# Patient Record
Sex: Female | Born: 1975
Health system: Southern US, Community
[De-identification: ages and names within clinical notes are randomized; demographics above are authoritative.]

## PROBLEM LIST (undated history)

## (undated) DIAGNOSIS — R600 Localized edema: Secondary | ICD-10-CM

## (undated) DIAGNOSIS — F419 Anxiety disorder, unspecified: Secondary | ICD-10-CM

## (undated) DIAGNOSIS — F909 Attention-deficit hyperactivity disorder, unspecified type: Secondary | ICD-10-CM

## (undated) DIAGNOSIS — M549 Dorsalgia, unspecified: Secondary | ICD-10-CM

## (undated) DIAGNOSIS — K5900 Constipation, unspecified: Secondary | ICD-10-CM

## (undated) DIAGNOSIS — R51 Headache: Secondary | ICD-10-CM

## (undated) DIAGNOSIS — F329 Major depressive disorder, single episode, unspecified: Secondary | ICD-10-CM

## (undated) DIAGNOSIS — B009 Herpesviral infection, unspecified: Secondary | ICD-10-CM

## (undated) DIAGNOSIS — F32A Depression, unspecified: Secondary | ICD-10-CM

## (undated) DIAGNOSIS — G5600 Carpal tunnel syndrome, unspecified upper limb: Secondary | ICD-10-CM

## (undated) DIAGNOSIS — M2550 Pain in unspecified joint: Secondary | ICD-10-CM

## (undated) DIAGNOSIS — F99 Mental disorder, not otherwise specified: Secondary | ICD-10-CM

## (undated) DIAGNOSIS — G039 Meningitis, unspecified: Secondary | ICD-10-CM

## (undated) DIAGNOSIS — L709 Acne, unspecified: Secondary | ICD-10-CM

## (undated) DIAGNOSIS — K08409 Partial loss of teeth, unspecified cause, unspecified class: Secondary | ICD-10-CM

## (undated) DIAGNOSIS — B379 Candidiasis, unspecified: Secondary | ICD-10-CM

## (undated) DIAGNOSIS — O99119 Other diseases of the blood and blood-forming organs and certain disorders involving the immune mechanism complicating pregnancy, unspecified trimester: Secondary | ICD-10-CM

## (undated) DIAGNOSIS — D696 Thrombocytopenia, unspecified: Secondary | ICD-10-CM

## (undated) HISTORY — DX: Acne, unspecified: L70.9

## (undated) HISTORY — DX: Herpesviral infection, unspecified: B00.9

## (undated) HISTORY — DX: Constipation, unspecified: K59.00

## (undated) HISTORY — DX: Candidiasis, unspecified: B37.9

## (undated) HISTORY — DX: Carpal tunnel syndrome, unspecified upper limb: G56.00

## (undated) HISTORY — PX: OTHER SURGICAL HISTORY: SHX169

## (undated) HISTORY — DX: Pain in unspecified joint: M25.50

## (undated) HISTORY — DX: Attention-deficit hyperactivity disorder, unspecified type: F90.9

## (undated) HISTORY — DX: Dorsalgia, unspecified: M54.9

## (undated) HISTORY — DX: Localized edema: R60.0

## (undated) HISTORY — DX: Meningitis, unspecified: G03.9

## (undated) HISTORY — DX: Partial loss of teeth, unspecified cause, unspecified class: K08.409

## (undated) HISTORY — DX: Headache: R51

## (undated) HISTORY — PX: BREAST SURGERY: SHX581

---

## 1999-10-12 ENCOUNTER — Encounter: Admission: RE | Admit: 1999-10-12 | Discharge: 1999-10-12 | Payer: Self-pay | Admitting: Psychiatry

## 2001-09-15 DIAGNOSIS — K08409 Partial loss of teeth, unspecified cause, unspecified class: Secondary | ICD-10-CM

## 2001-09-15 HISTORY — DX: Partial loss of teeth, unspecified cause, unspecified class: K08.409

## 2002-08-30 ENCOUNTER — Other Ambulatory Visit (HOSPITAL_COMMUNITY): Admission: RE | Admit: 2002-08-30 | Discharge: 2002-09-19 | Payer: Self-pay | Admitting: Psychiatry

## 2002-12-20 ENCOUNTER — Other Ambulatory Visit: Admission: RE | Admit: 2002-12-20 | Discharge: 2002-12-20 | Payer: Self-pay | Admitting: Obstetrics and Gynecology

## 2003-05-02 ENCOUNTER — Ambulatory Visit (HOSPITAL_COMMUNITY): Admission: RE | Admit: 2003-05-02 | Discharge: 2003-05-02 | Payer: Self-pay | Admitting: Obstetrics and Gynecology

## 2003-05-02 ENCOUNTER — Encounter: Payer: Self-pay | Admitting: Obstetrics and Gynecology

## 2003-07-10 ENCOUNTER — Inpatient Hospital Stay (HOSPITAL_COMMUNITY): Admission: AD | Admit: 2003-07-10 | Discharge: 2003-07-12 | Payer: Self-pay | Admitting: Obstetrics and Gynecology

## 2003-07-25 ENCOUNTER — Encounter: Admission: RE | Admit: 2003-07-25 | Discharge: 2003-08-24 | Payer: Self-pay | Admitting: Obstetrics and Gynecology

## 2003-12-23 ENCOUNTER — Other Ambulatory Visit: Admission: RE | Admit: 2003-12-23 | Discharge: 2003-12-23 | Payer: Self-pay | Admitting: Obstetrics and Gynecology

## 2005-01-25 ENCOUNTER — Other Ambulatory Visit: Admission: RE | Admit: 2005-01-25 | Discharge: 2005-01-25 | Payer: Self-pay | Admitting: Obstetrics and Gynecology

## 2006-02-10 ENCOUNTER — Other Ambulatory Visit: Admission: RE | Admit: 2006-02-10 | Discharge: 2006-02-10 | Payer: Self-pay | Admitting: Obstetrics and Gynecology

## 2010-01-15 ENCOUNTER — Inpatient Hospital Stay (HOSPITAL_COMMUNITY): Admission: AD | Admit: 2010-01-15 | Discharge: 2010-01-17 | Payer: Self-pay | Admitting: Obstetrics and Gynecology

## 2010-07-13 ENCOUNTER — Ambulatory Visit (HOSPITAL_COMMUNITY): Admission: RE | Admit: 2010-07-13 | Payer: Self-pay | Source: Home / Self Care | Admitting: Family Medicine

## 2010-08-08 ENCOUNTER — Encounter: Payer: Self-pay | Admitting: Family Medicine

## 2010-10-03 LAB — CBC
HCT: 34.7 % — ABNORMAL LOW (ref 36.0–46.0)
HCT: 35.1 % — ABNORMAL LOW (ref 36.0–46.0)
HCT: 40 % (ref 36.0–46.0)
Hemoglobin: 13.7 g/dL (ref 12.0–15.0)
MCH: 30.7 pg (ref 26.0–34.0)
MCH: 31 pg (ref 26.0–34.0)
MCH: 31.3 pg (ref 26.0–34.0)
MCHC: 34.3 g/dL (ref 30.0–36.0)
MCHC: 34.7 g/dL (ref 30.0–36.0)
MCV: 89.4 fL (ref 78.0–100.0)
MCV: 89.7 fL (ref 78.0–100.0)
Platelets: 100 10*3/uL — ABNORMAL LOW (ref 150–400)
Platelets: 106 10*3/uL — ABNORMAL LOW (ref 150–400)
RBC: 3.93 MIL/uL (ref 3.87–5.11)
RBC: 4.46 MIL/uL (ref 3.87–5.11)
RDW: 13.6 % (ref 11.5–15.5)
RDW: 13.6 % (ref 11.5–15.5)
RDW: 14 % (ref 11.5–15.5)
WBC: 14.3 10*3/uL — ABNORMAL HIGH (ref 4.0–10.5)

## 2010-10-03 LAB — RPR: RPR Ser Ql: NONREACTIVE

## 2010-12-03 NOTE — H&P (Signed)
NAME:  Paula Burke, Paula Burke                           ACCOUNT NO.:  [id number]   MEDICAL RECORD NO.:  [id number]                   PATIENT TYPE:  INP   LOCATION:  9176                                 FACILITY:  [facility]   PHYSICIAN:  Janine Limbo, M.D.            DATE OF BIRTH:  1976/03/24   DATE OF ADMISSION:  07/10/2003  DATE OF DISCHARGE:                                HISTORY & PHYSICAL   HISTORY OF PRESENT ILLNESS:  This is a 35 year old gravida 3 para 0-0-2-0 at  10 and three-sevenths weeks who presents to the office with complaints of  uterine contractions since 1 a.m. which have been worse in the last three  hours.  Denies leaking or bleeding and reports positive fetal movement.  Pregnancy has been followed by the nurse midwife service and remarkable for:  1. Depression.  2. History of migraines.  3. Thrombocytopenia.  4. Group B strep negative.   OBSTETRICAL HISTORY:  Remarkable for elective abortions in 1994 and 2002 in  the first trimester with no complications.   MEDICAL HISTORY:  Remarkable for history of HPV, history of childhood  varicella, hepatitis B vaccine, history of migraines, history of depression  for which she currently takes Zoloft and is followed by Dr. Evelene Croon.   SURGICAL HISTORY:  Remarkable for wisdom teeth in 2003.   FAMILY HISTORY:  Remarkable for varicose veins in her mother.  Blood clots  in her grandmother.  Emphysema in her grandmother and grandfather.  Migraines in her mother.  Stroke in her grandmother.  Lung cancer in her  grandfather.  Ovarian cancer in her grandmother.  Depression in her father.  Drug use in her mother and father.   GENETIC HISTORY:  Remarkable for an aunt who had twins.   SOCIAL HISTORY:  The patient is married to Annye Rusk who is involved and  supportive.  She is of the [place] faith.  She works as an Astronomer. and he  works as a Engineer, structural.  She denies any alcohol, tobacco, or drug use.   PRENATAL LABORATORY DATA:   Hemoglobin 13.3, platelet count 139 at new OB and  115 in October.  Blood type O positive, antibody screen negative.  Toxo  negative.  RPR negative.  Rubella immune.  Hepatitis negative.  HIV  negative.  Pap test normal.  Gonorrhea negative, chlamydia negative.  Group  B strep negative.   OBJECTIVE DATA:  VITAL SIGNS:  Stable, afebrile.  HEENT:  Within normal limits.  Thyroid normal, not enlarged.  CHEST:  Clear to auscultation.  HEART:  Regular rate and rhythm.  ABDOMEN:  Gravid at 37 cm, vertex to Leopold's.  Fetal heart rate 150.  PELVIC:  Cervical exam 2-3 cm, 90% effaced, -1 to 0 station.  Uterine  contractions are every two minutes and strong.  Vertex presentation.  EXTREMITIES:  Within normal limits.   ASSESSMENT:  1. Intrauterine pregnancy at term.  2. Early active labor.  3. Desires epidural.  4. Thrombocytopenia.  5. Group B Streptococcus negative.   PLAN:  1. Admit to birthing suites; Nigel Bridgeman notified.  2. Routine C.N.M. orders.  3. Epidural in active labor.  4. CBC and routine labs.  5. Further orders to follow.     Marie L. Williams, C.N.M.                 Janine Limbo, M.D.    MLW/MEDQ  D:  07/10/2003  T:  07/10/2003  Job:  [id number]

## 2011-04-04 LAB — ABO/RH: RH Type: POSITIVE

## 2011-04-04 LAB — HEPATITIS B SURFACE ANTIGEN: Hepatitis B Surface Ag: NEGATIVE

## 2011-04-04 LAB — RUBELLA ANTIBODY, IGM: Rubella: IMMUNE

## 2011-04-04 LAB — RPR: RPR: NONREACTIVE

## 2011-07-19 NOTE — L&D Delivery Note (Signed)
Delivery Note Pt admitted in active labor w/ ctxs every 1-2 minutes and pt having difficult time coping; moaning, difficulty breathing, and changing position franticly.  Cx on admission 5-6 cm/90%.  After transferring to L&D, attempted to start an IV, but RN unable.  Pt desired epidural, but labor progressing rapidly.  Difficulty tracing FHR w/ pt's positions, but when audible, in 150s.  AROM at 1413 and noted MSF; not particulate.  Pt complete at 1420.  Suspected OP b/c cx had been thicker anteriorly, and had tried to reduced before complete.  Once repositioned pt to her Lt side, cx fully dilated.  Pushed well to SVD At 2:38 PM.  A viable female "[place]" was delivered via Vaginal, Spontaneous Delivery (Presentation: Leftt Occiput Posterior).  APGAR: 8, 8; weight 7 lb 4.6 oz (3306 g).  Thicker meconium noted as delivered, but newborn w/ spontaneous cry as dried on mom's abdomen.  Cord doubly clamped and cut by FOB.  Newborn did receive deep suction secondary to thick secretions.   Placenta status: Intact, Spontaneous, Schultz--pt desires to take home with her at discharge. Cord: 3 vessels with the following complications: None.  Cord pH: n/a.  Anesthesia: Local Episiotomy: None Lacerations: 2nd degree Suture Repair: 3-0 monocryl Est. Blood Loss (mL): 350  Mom to postpartum.  Baby to nursery-stable. Pt reports platelets=115,000 yesterday. Will draw CBC now and tomorrow AM.  Antonietta Breach 10/28/2011, 3:56 PM

## 2011-09-29 ENCOUNTER — Encounter (INDEPENDENT_AMBULATORY_CARE_PROVIDER_SITE_OTHER): Payer: PRIVATE HEALTH INSURANCE | Admitting: Obstetrics and Gynecology

## 2011-09-29 DIAGNOSIS — Z331 Pregnant state, incidental: Secondary | ICD-10-CM

## 2011-10-06 ENCOUNTER — Encounter (INDEPENDENT_AMBULATORY_CARE_PROVIDER_SITE_OTHER): Payer: PRIVATE HEALTH INSURANCE | Admitting: Obstetrics and Gynecology

## 2011-10-06 DIAGNOSIS — Z3480 Encounter for supervision of other normal pregnancy, unspecified trimester: Secondary | ICD-10-CM

## 2011-10-13 ENCOUNTER — Encounter (INDEPENDENT_AMBULATORY_CARE_PROVIDER_SITE_OTHER): Payer: PRIVATE HEALTH INSURANCE | Admitting: Obstetrics and Gynecology

## 2011-10-13 DIAGNOSIS — Z331 Pregnant state, incidental: Secondary | ICD-10-CM

## 2011-10-19 ENCOUNTER — Encounter (INDEPENDENT_AMBULATORY_CARE_PROVIDER_SITE_OTHER): Payer: PRIVATE HEALTH INSURANCE | Admitting: Obstetrics and Gynecology

## 2011-10-19 DIAGNOSIS — Z331 Pregnant state, incidental: Secondary | ICD-10-CM

## 2011-10-20 ENCOUNTER — Encounter: Payer: PRIVATE HEALTH INSURANCE | Admitting: Obstetrics and Gynecology

## 2011-10-27 ENCOUNTER — Encounter: Payer: Self-pay | Admitting: Obstetrics and Gynecology

## 2011-10-27 ENCOUNTER — Ambulatory Visit (INDEPENDENT_AMBULATORY_CARE_PROVIDER_SITE_OTHER): Payer: PRIVATE HEALTH INSURANCE | Admitting: Obstetrics and Gynecology

## 2011-10-27 VITALS — BP 100/60 | Ht 61.0 in | Wt 204.0 lb

## 2011-10-27 DIAGNOSIS — D696 Thrombocytopenia, unspecified: Secondary | ICD-10-CM

## 2011-10-27 DIAGNOSIS — Z331 Pregnant state, incidental: Secondary | ICD-10-CM

## 2011-10-27 NOTE — Progress Notes (Signed)
Pt without complaints A/P GBS negative Fetal kick counts reviewed Labor reviewed with pt Pt with history of thrombocytopenia check cbc today nst at nv pt will be past due All patients  questions answered

## 2011-10-28 ENCOUNTER — Inpatient Hospital Stay (HOSPITAL_COMMUNITY)
Admission: AD | Admit: 2011-10-28 | Discharge: 2011-10-30 | DRG: 775 | Disposition: A | Discharge status: Home / Self Care | Payer: PRIVATE HEALTH INSURANCE | Source: Ambulatory Visit | Attending: Obstetrics and Gynecology | Admitting: Obstetrics and Gynecology

## 2011-10-28 ENCOUNTER — Encounter (HOSPITAL_COMMUNITY): Payer: Self-pay | Admitting: *Deleted

## 2011-10-28 ENCOUNTER — Telehealth: Payer: Self-pay | Admitting: Obstetrics and Gynecology

## 2011-10-28 DIAGNOSIS — O9912 Other diseases of the blood and blood-forming organs and certain disorders involving the immune mechanism complicating childbirth: Secondary | ICD-10-CM | POA: Diagnosis present

## 2011-10-28 DIAGNOSIS — D689 Coagulation defect, unspecified: Secondary | ICD-10-CM

## 2011-10-28 DIAGNOSIS — O878 Other venous complications in the puerperium: Secondary | ICD-10-CM | POA: Diagnosis present

## 2011-10-28 DIAGNOSIS — F418 Other specified anxiety disorders: Secondary | ICD-10-CM | POA: Diagnosis present

## 2011-10-28 DIAGNOSIS — IMO0001 Reserved for inherently not codable concepts without codable children: Secondary | ICD-10-CM

## 2011-10-28 DIAGNOSIS — D696 Thrombocytopenia, unspecified: Secondary | ICD-10-CM | POA: Diagnosis present

## 2011-10-28 DIAGNOSIS — O328XX0 Maternal care for other malpresentation of fetus, not applicable or unspecified: Secondary | ICD-10-CM | POA: Clinically undetermined

## 2011-10-28 DIAGNOSIS — K645 Perianal venous thrombosis: Secondary | ICD-10-CM | POA: Diagnosis present

## 2011-10-28 DIAGNOSIS — E669 Obesity, unspecified: Secondary | ICD-10-CM | POA: Diagnosis present

## 2011-10-28 HISTORY — DX: Other diseases of the blood and blood-forming organs and certain disorders involving the immune mechanism complicating pregnancy, unspecified trimester: D69.6

## 2011-10-28 HISTORY — DX: Depression, unspecified: F32.A

## 2011-10-28 HISTORY — DX: Other diseases of the blood and blood-forming organs and certain disorders involving the immune mechanism complicating pregnancy, unspecified trimester: O99.119

## 2011-10-28 HISTORY — DX: Mental disorder, not otherwise specified: F99

## 2011-10-28 HISTORY — DX: Major depressive disorder, single episode, unspecified: F32.9

## 2011-10-28 HISTORY — DX: Anxiety disorder, unspecified: F41.9

## 2011-10-28 LAB — CBC
Hemoglobin: 14.3 g/dL (ref 12.0–15.0)
MCH: 29.4 pg (ref 26.0–34.0)
MCH: 30 pg (ref 26.0–34.0)
MCHC: 34.5 g/dL (ref 30.0–36.0)
MCV: 87 fL (ref 78.0–100.0)
Platelets: 93 10*3/uL — ABNORMAL LOW (ref 150–400)
Platelets: 96 10*3/uL — ABNORMAL LOW (ref 150–400)
RBC: 4.59 MIL/uL (ref 3.87–5.11)
RDW: 15 % (ref 11.5–15.5)

## 2011-10-28 LAB — RPR: RPR Ser Ql: NONREACTIVE

## 2011-10-28 MED ORDER — OXYCODONE-ACETAMINOPHEN 5-325 MG PO TABS
1.0000 | ORAL_TABLET | ORAL | Status: DC | PRN
  Administered 2011-10-28: 1 via ORAL
  Filled 2011-10-28: qty 1

## 2011-10-28 MED ORDER — DIBUCAINE 1 % RE OINT
1.0000 "application " | TOPICAL_OINTMENT | RECTAL | Status: DC | PRN
  Administered 2011-10-29: 1 via RECTAL
  Filled 2011-10-28: qty 28

## 2011-10-28 MED ORDER — WITCH HAZEL-GLYCERIN EX PADS
1.0000 "application " | MEDICATED_PAD | CUTANEOUS | Status: DC | PRN
  Administered 2011-10-28: 1 via TOPICAL

## 2011-10-28 MED ORDER — TETANUS-DIPHTH-ACELL PERTUSSIS 5-2.5-18.5 LF-MCG/0.5 IM SUSP
0.5000 mL | Freq: Once | INTRAMUSCULAR | Status: AC
  Administered 2011-10-29: 0.5 mL via INTRAMUSCULAR
  Filled 2011-10-28: qty 0.5

## 2011-10-28 MED ORDER — MEDROXYPROGESTERONE ACETATE 150 MG/ML IM SUSP: 150.0000 mg | INTRAMUSCULAR | Status: DC | PRN

## 2011-10-28 MED ORDER — SIMETHICONE 80 MG PO CHEW: 80.0000 mg | CHEWABLE_TABLET | ORAL | Status: DC | PRN

## 2011-10-28 MED ORDER — ONDANSETRON HCL 4 MG/2ML IJ SOLN: 4.0000 mg | INTRAMUSCULAR | Status: DC | PRN

## 2011-10-28 MED ORDER — PRENATAL MULTIVITAMIN CH
1.0000 | ORAL_TABLET | Freq: Every day | ORAL | Status: DC
  Administered 2011-10-29 – 2011-10-30 (×2): 1 via ORAL
  Filled 2011-10-28 (×2): qty 1

## 2011-10-28 MED ORDER — HYDROXYZINE HCL 50 MG/ML IM SOLN: 50.0000 mg | Freq: Four times a day (QID) | INTRAMUSCULAR | Status: DC | PRN

## 2011-10-28 MED ORDER — ACETAMINOPHEN 325 MG PO TABS: 650.0000 mg | ORAL_TABLET | ORAL | Status: DC | PRN

## 2011-10-28 MED ORDER — SENNOSIDES-DOCUSATE SODIUM 8.6-50 MG PO TABS
2.0000 | ORAL_TABLET | Freq: Every day | ORAL | Status: DC
  Administered 2011-10-28 – 2011-10-29 (×2): 2 via ORAL

## 2011-10-28 MED ORDER — DIPHENHYDRAMINE HCL 25 MG PO CAPS: 25.0000 mg | ORAL_CAPSULE | Freq: Four times a day (QID) | ORAL | Status: DC | PRN

## 2011-10-28 MED ORDER — FENTANYL 2.5 MCG/ML BUPIVACAINE 1/10 % EPIDURAL INFUSION (WH - ANES): 14.0000 mL/h | INTRAMUSCULAR | Status: DC

## 2011-10-28 MED ORDER — MISOPROSTOL 200 MCG PO TABS
800.0000 ug | ORAL_TABLET | Freq: Once | ORAL | Status: AC
  Administered 2011-10-28: 800 ug via RECTAL
  Filled 2011-10-28: qty 1

## 2011-10-28 MED ORDER — MAGNESIUM HYDROXIDE 400 MG/5ML PO SUSP: 30.0000 mL | ORAL | Status: DC | PRN

## 2011-10-28 MED ORDER — MISOPROSTOL 200 MCG PO TABS
ORAL_TABLET | ORAL | Status: AC
  Filled 2011-10-28: qty 4

## 2011-10-28 MED ORDER — CITRIC ACID-SODIUM CITRATE 334-500 MG/5ML PO SOLN: 30.0000 mL | ORAL | Status: DC | PRN

## 2011-10-28 MED ORDER — BENZOCAINE-MENTHOL 20-0.5 % EX AERO
1.0000 "application " | INHALATION_SPRAY | CUTANEOUS | Status: DC | PRN
  Administered 2011-10-28: 1 via TOPICAL

## 2011-10-28 MED ORDER — LACTATED RINGERS IV SOLN: 500.0000 mL | Freq: Once | INTRAVENOUS | Status: DC

## 2011-10-28 MED ORDER — IBUPROFEN 600 MG PO TABS
600.0000 mg | ORAL_TABLET | Freq: Four times a day (QID) | ORAL | Status: DC
  Administered 2011-10-29 – 2011-10-30 (×5): 600 mg via ORAL
  Filled 2011-10-28 (×6): qty 1

## 2011-10-28 MED ORDER — LACTATED RINGERS IV SOLN: 500.0000 mL | INTRAVENOUS | Status: DC | PRN

## 2011-10-28 MED ORDER — SERTRALINE HCL 50 MG PO TABS
50.0000 mg | ORAL_TABLET | Freq: Every day | ORAL | Status: DC
  Administered 2011-10-29 – 2011-10-30 (×2): 50 mg via ORAL
  Filled 2011-10-28 (×4): qty 1

## 2011-10-28 MED ORDER — HYDROXYZINE HCL 50 MG PO TABS: 50.0000 mg | ORAL_TABLET | Freq: Four times a day (QID) | ORAL | Status: DC | PRN

## 2011-10-28 MED ORDER — LACTATED RINGERS IV SOLN: INTRAVENOUS | Status: DC

## 2011-10-28 MED ORDER — PHENYLEPHRINE 40 MCG/ML (10ML) SYRINGE FOR IV PUSH (FOR BLOOD PRESSURE SUPPORT): 80.0000 ug | PREFILLED_SYRINGE | INTRAVENOUS | Status: DC | PRN

## 2011-10-28 MED ORDER — EPHEDRINE 5 MG/ML INJ: 10.0000 mg | INTRAVENOUS | Status: DC | PRN

## 2011-10-28 MED ORDER — DIPHENHYDRAMINE HCL 50 MG/ML IJ SOLN: 12.5000 mg | INTRAMUSCULAR | Status: DC | PRN

## 2011-10-28 MED ORDER — OXYTOCIN 20 UNITS IN LACTATED RINGERS INFUSION - SIMPLE: 125.0000 mL/h | Freq: Once | INTRAVENOUS | Status: DC

## 2011-10-28 MED ORDER — FLEET ENEMA 7-19 GM/118ML RE ENEM: 1.0000 | ENEMA | RECTAL | Status: DC | PRN

## 2011-10-28 MED ORDER — IBUPROFEN 600 MG PO TABS
600.0000 mg | ORAL_TABLET | Freq: Four times a day (QID) | ORAL | Status: DC | PRN
  Administered 2011-10-28: 600 mg via ORAL
  Filled 2011-10-28: qty 1

## 2011-10-28 MED ORDER — OXYCODONE-ACETAMINOPHEN 5-325 MG PO TABS
1.0000 | ORAL_TABLET | ORAL | Status: DC | PRN
  Administered 2011-10-28 – 2011-10-29 (×3): 1 via ORAL
  Filled 2011-10-28 (×3): qty 1

## 2011-10-28 MED ORDER — LIDOCAINE HCL (PF) 1 % IJ SOLN
30.0000 mL | INTRAMUSCULAR | Status: DC | PRN
  Filled 2011-10-28: qty 30

## 2011-10-28 MED ORDER — BUTORPHANOL TARTRATE 2 MG/ML IJ SOLN: 1.0000 mg | INTRAMUSCULAR | Status: DC | PRN

## 2011-10-28 MED ORDER — BENZOCAINE-MENTHOL 20-0.5 % EX AERO
INHALATION_SPRAY | CUTANEOUS | Status: AC
  Filled 2011-10-28: qty 56

## 2011-10-28 MED ORDER — ZOLPIDEM TARTRATE 5 MG PO TABS: 5.0000 mg | ORAL_TABLET | Freq: Every evening | ORAL | Status: DC | PRN

## 2011-10-28 MED ORDER — ONDANSETRON HCL 4 MG/2ML IJ SOLN: 4.0000 mg | Freq: Four times a day (QID) | INTRAMUSCULAR | Status: DC | PRN

## 2011-10-28 MED ORDER — OXYTOCIN BOLUS FROM INFUSION
500.0000 mL | Freq: Once | INTRAVENOUS | Status: DC
  Filled 2011-10-28: qty 500

## 2011-10-28 MED ORDER — LANOLIN HYDROUS EX OINT: TOPICAL_OINTMENT | CUTANEOUS | Status: DC | PRN

## 2011-10-28 MED ORDER — ONDANSETRON HCL 4 MG PO TABS: 4.0000 mg | ORAL_TABLET | ORAL | Status: DC | PRN

## 2011-10-28 NOTE — Telephone Encounter (Signed)
Spoke w/pt.  States having cont since 6AM.   Now q 2-3 min.   Unable to talk through them.  ?FM   Mucousy vag D/C. Per [facility] directed to [facility].

## 2011-10-29 DIAGNOSIS — F418 Other specified anxiety disorders: Secondary | ICD-10-CM | POA: Diagnosis present

## 2011-10-29 LAB — CBC
MCHC: 33.9 g/dL (ref 30.0–36.0)
RDW: 14.6 % (ref 11.5–15.5)
WBC: 15.1 10*3/uL — ABNORMAL HIGH (ref 4.0–10.5)

## 2011-10-29 NOTE — Progress Notes (Signed)
Post Partum Day 1--s/p SVB Subjective: Doing well.  Up ad lib without syncope or dizziness.  Breastfeeding.  Objective: Blood pressure 99/60, pulse 70, temperature 97.6 F (36.4 C), temperature source Oral, resp. rate 18, last menstrual period 01/22/2011, SpO2 98.00%, unknown if currently breastfeeding.  Physical Exam:  General: alert Lochia: appropriate Uterine Fundus: firm Incision: healing well DVT Evaluation: No evidence of DVT seen on physical exam. Negative Homan's sign.   Basename 10/29/11 0541 10/28/11 1550  HGB 13.0 14.3  HCT 38.4 41.5   Results for orders placed during the hospital encounter of 10/28/11 (from the past 24 hour(s))  CBC     Status: Abnormal   Collection Time   10/28/11  3:50 PM      Component Value Range   WBC 20.6 (*) 4.0 - 10.5 (K/uL)   RBC 4.77  3.87 - 5.11 (MIL/uL)   Hemoglobin 14.3  12.0 - 15.0 (g/dL)   HCT 16.1  09.6 - 04.5 (%)   MCV 87.0  78.0 - 100.0 (fL)   MCH 30.0  26.0 - 34.0 (pg)   MCHC 34.5  30.0 - 36.0 (g/dL)   RDW 40.9  81.1 - 91.4 (%)   Platelets 96 (*) 150 - 400 (K/uL)  RPR     Status: Normal   Collection Time   10/28/11  3:50 PM      Component Value Range   RPR NON REACTIVE  NON REACTIVE   CBC     Status: Abnormal   Collection Time   10/29/11  5:41 AM      Component Value Range   WBC 15.1 (*) 4.0 - 10.5 (K/uL)   RBC 4.39  3.87 - 5.11 (MIL/uL)   Hemoglobin 13.0  12.0 - 15.0 (g/dL)   HCT 78.2  95.6 - 21.3 (%)   MCV 87.5  78.0 - 100.0 (fL)   MCH 29.6  26.0 - 34.0 (pg)   MCHC 33.9  30.0 - 36.0 (g/dL)   RDW 08.6  57.8 - 46.9 (%)   Platelets 88 (*) 150 - 400 (K/uL)   Platelet count at NOB = 126 Platelet count at 28 weeks = 120 Platelet count 10/27/11 = 93   Assessment/Plan: Stable pp day 1 Thrombocytopenia (hx with all pregnancies)  Plan: Continue current care Repeat CBC tomorrow. Anticipate d/c tomorrow.   LOS: 1 day   Lashina Milles, Chip Boer 10/29/2011, 7:50 AM

## 2011-10-29 NOTE — H&P (Signed)
Paula Burke is a 36 y.o.obese MWF female presenting in active labor at 39.6 weeks.  Reports onset of ctxs between 0630 and 0700.  Some "brownish" mucousy d/c, but no BRB, and no LOF.  Denies any UTI or PIH s/s.  GFM.  No recent illness or fever.  Accompanied to hospital by her husband.  Cx "1cm" at her office appt yesterday.  CBC drawn yesterday to check platelets, and =93.  Pt's pregnancy has been overall uncomplicated with exception of following platelets over course of pregnancy (see values in Prenatal labs below).  She started care at 10.6 weeks.  Declined any aneuploidy screens.  Normal anatomy u/s.  She was c/o "cold" around 23 weeks, and it persisted, so treated w/ a Zpak around 27 weeks.  She has displayed carpal tunnel syndrome in 3rd trimester.  Pt was interested in waterbirth and attended class, but recently decided not to proceed with use of tub for labor OR delivery.  She presents with desire for epidural.    OB HX: G1=EAB 1994 G2=EAB 2002 G3=SVD 2004 at 40 weeks, Female=8lb (thrombocytopenia) G4=SVD 2011 at 40 weeks, Female=7+10 (also thrombocytopenia) G5=current    Maternal Medical History:  Reason for admission: Reason for admission: contractions.  Contractions: Onset was 6-12 hours ago.   Frequency: regular.   Perceived severity is strong.    Fetal activity: Perceived fetal activity is normal.   Last perceived fetal movement was within the past hour.    Prenatal complications: 1.  Thrombocytopenia in pregnancy 2.  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  Obese 5.  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  Closely-spaced pregnancies 7.  H/o EAB x2    OB History    Grav Para Term Preterm Abortions TAB SAB Ect Mult Living   5 3 3  2     3      Past Medical History  Diagnosis Date  . Thrombocytopenia complicating pregnancy   . Mental disorder   . Depression   . Anxiety    History reviewed. No pertinent  past surgical history. Family History: Father:  COPD, lung CA, CVA w/ post-stroke seizures, depression.  MGM:  Hypothyroidism; Mother:  Varicosities.   Social History:  does not have a smoking history on file. She does not have any smokeless tobacco history on file. She reports that she does not use illicit drugs. 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  Her husband has had some post-secondary schooling, and is a Water quality scientist; he is involved and supportive.    Review of Systems  Constitutional: Negative.   HENT: Negative.   Eyes: Negative.   Respiratory: Negative.   Cardiovascular: Negative.   Gastrointestinal: Negative.   Genitourinary: Negative.   Skin: Negative.   Neurological: Negative.     Blood pressure 99/60, pulse 70, temperature 97.6 F (36.4 C), temperature source Oral, resp. rate 18, last menstrual period 01/22/2011, SpO2 98.00%, unknown if currently breastfeeding. Maternal Exam:  Uterine Assessment: Contraction strength is firm.  Contraction frequency is regular.  UC's 1-2 min  Abdomen: Patient reports no abdominal tenderness. Fetal presentation: vertex  Introitus: Normal vulva. Pelvis: adequate for delivery.   Cervix: Cervix evaluated by digital exam.     Fetal Exam Fetal Monitor Review: Mode: hand-held doppler probe.   Baseline rate: 150s but very difficult to trace w/ pt's frequent position changes and flailing in bed.  Variability: moderate (  6-25 bpm).    Fetal State Assessment: Category II - tracings are indeterminate.     Physical Exam  Constitutional: She is oriented to person, place, and time. She appears well-developed and well-nourished. She appears distressed.       Pt screaming and breathing w/ ctxs; frantically writhing around and difficulty coping.  Repeating "I can't, I can't."  HENT:  Head: Normocephalic and atraumatic.  Cardiovascular: Normal rate.   Respiratory: Breath sounds normal.        Hyperventilating as labor progressing and having to coach pt to slow down breathing  GI: Soft.       gravid  Genitourinary:       cx on admission=6/90/-1, intact, soft  Musculoskeletal: She exhibits edema.       1+ BLE edema  Neurological: She is alert and oriented to person, place, and time.  Skin: Skin is warm and dry.    Prenatal labs: ABO, Rh: O/Positive/-- (09/17 0000) Antibody: Negative (09/17 0000) Rubella: Immune (09/17 0000) RPR: NON REACTIVE (04/12 1550)  HBsAg: Negative (09/17 0000)  HIV: Non-reactive (09/17 0000)  GBS: Negative (03/14 0000)  Platelets at NOB =126 Platelets at 1hr gtt=120 Platelets 10/27/11=93 1hr gtt=103 Assessment/Plan: 1.  IUP at 39.6 2.  Active labor 3.  Severe distress and discomfort 4. Cat II FHT with difficulty tracing 5.  Desires epidural 6.  GBS neg  1.  Admit to [facility] w/ Dr. Estanislado Pandy as attending 2.  Routine L&D orders and will attempt to obtain epidural 3.  Will remain at bedside for continuous support 4.  Anticipate SVD and rapid progress without epidural 5. C/w MD prn    Finnbar Cedillos H 10/29/2011, 7:11 AM

## 2011-10-29 NOTE — Progress Notes (Signed)

## 2011-10-30 LAB — CBC
Hemoglobin: 12.5 g/dL (ref 12.0–15.0)
Platelets: 94 10*3/uL — ABNORMAL LOW (ref 150–400)
RBC: 4.28 MIL/uL (ref 3.87–5.11)
WBC: 11.3 10*3/uL — ABNORMAL HIGH (ref 4.0–10.5)

## 2011-10-30 MED ORDER — DOCUSATE SODIUM 100 MG PO CAPS: 100.0000 mg | ORAL_CAPSULE | Freq: Two times a day (BID) | ORAL | Status: DC | PRN

## 2011-10-30 MED ORDER — SERTRALINE HCL 50 MG PO TABS: 100.0000 mg | ORAL_TABLET | Freq: Every day | ORAL | Status: DC

## 2011-10-30 NOTE — Discharge Summary (Signed)
Obstetric Discharge Summary Reason for Admission: onset of labor Prenatal Procedures: ultrasound Intrapartum Procedures: spontaneous vaginal delivery Postpartum Procedures: none Complications-Operative and Postpartum: 2 degree perineal laceration, severe hemorrhoids w/out thrombosis.   Pt tearful and reports depression and anxiety about taking care of an infant and toddler simultaneously. No SI/HI. Hemoglobin  Date Value Range Status  10/30/2011 12.5  12.0-15.0 (g/dL) Final     HCT  Date Value Range Status  10/30/2011 37.8  36.0-46.0 (%) Final    Physical Exam:  General: alert, cooperative and no physical distress, but tearful Lochia: appropriate Uterine Fundus: firm Incision: healing well DVT Evaluation: Negative Homan's sign. No significant calf/ankle edema.  Discharge Diagnoses: Term Pregnancy-delivered and hemorrhoids, pre-existing depression w/ exacerbation due to postpartum state  Discharge Information: Date: 10/30/2011 Activity: pelvic rest Diet: routine and increase fluids and fiber Medications: PNV, Ibuprofen, Colace and proctofoam. Increase Zoloft to 100 mg QD Condition: stable Instructions: refer to practice specific booklet. Discussed holistic approach to managing postpartum depression and warning signs that should prompt pt to seek medical attention. Husband present and supportive. Discharge to: home Follow-up Information    Follow up with [facility] in 6 weeks. (or as needed)    Contact information:   [address] [place] [address]        Restart counseling PRN  Newborn Data: Live born female  Birth Weight: 7 lb 4.6 oz (3306 g) APGAR: 8, 8  Home with mother.  Dorathy Kinsman 10/30/2011, 9:38 AM

## 2011-12-06 ENCOUNTER — Ambulatory Visit: Payer: PRIVATE HEALTH INSURANCE | Admitting: Obstetrics and Gynecology

## 2011-12-06 ENCOUNTER — Encounter: Payer: Self-pay | Admitting: Obstetrics and Gynecology

## 2011-12-06 ENCOUNTER — Ambulatory Visit (INDEPENDENT_AMBULATORY_CARE_PROVIDER_SITE_OTHER): Payer: PRIVATE HEALTH INSURANCE | Admitting: Obstetrics and Gynecology

## 2011-12-06 VITALS — BP 120/76 | Temp 98.9°F | Resp 14 | Ht 61.0 in | Wt 179.0 lb

## 2011-12-06 DIAGNOSIS — Z331 Pregnant state, incidental: Secondary | ICD-10-CM

## 2011-12-06 DIAGNOSIS — D696 Thrombocytopenia, unspecified: Secondary | ICD-10-CM

## 2011-12-06 LAB — CBC
HCT: 45.7 % (ref 36.0–46.0)
Hemoglobin: 15.5 g/dL — ABNORMAL HIGH (ref 12.0–15.0)
RBC: 5.33 MIL/uL — ABNORMAL HIGH (ref 3.87–5.11)
WBC: 7.8 10*3/uL (ref 4.0–10.5)

## 2011-12-06 NOTE — Progress Notes (Signed)
Addended by: Tim Lair on: 12/06/2011 10:51 AM   Modules accepted: Orders

## 2011-12-06 NOTE — Progress Notes (Signed)
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   Ms. Paula Burke is a 36 y.o. year old female,G5P3023, who presents for a postpartum visit.  Subjective:  She returns to work Quarry manager.  She is sad about that.  Otherwise she is doing well.  She is currently on Zoloft.  Objective:  BP 120/76  Temp(Src) 98.9 F (37.2 C) (Oral)  Resp 14  Ht 5\' 1"  (1.549 m)  Wt 179 lb (81.194 kg)  BMI 33.82 kg/m2  Breastfeeding? Yes   GI: soft, non-tender; bowel sounds normal; no masses,  no organomegaly  External genitalia: normal general appearance Vaginal: normal without tenderness, induration or masses Cervix: normal appearance Adnexa: normal bimanual exam Uterus: normal size shape and consistency  Assessment:  Doing well after her vaginal delivery Thrombocytopenia Stable depression and anxiety  Plan:  CBC Return to normal activities EPDS 6 Breast-feeding Return to office in August 2013 Condoms for contraception Sex after delivery discussed  Return to office in 3 month(s).   Paula Burke M.D.  12/06/2011 10:20 AM

## 2014-05-19 ENCOUNTER — Encounter: Payer: Self-pay | Admitting: Obstetrics and Gynecology

## 2015-05-19 DIAGNOSIS — F330 Major depressive disorder, recurrent, mild: Secondary | ICD-10-CM | POA: Insufficient documentation

## 2015-05-30 DIAGNOSIS — B003 Herpesviral meningitis: Secondary | ICD-10-CM | POA: Insufficient documentation

## 2016-02-08 DIAGNOSIS — Z975 Presence of (intrauterine) contraceptive device: Secondary | ICD-10-CM | POA: Insufficient documentation

## 2016-03-28 DIAGNOSIS — Z719 Counseling, unspecified: Secondary | ICD-10-CM | POA: Insufficient documentation

## 2017-12-29 DIAGNOSIS — G4726 Circadian rhythm sleep disorder, shift work type: Secondary | ICD-10-CM | POA: Insufficient documentation

## 2018-06-25 ENCOUNTER — Telehealth: Payer: Self-pay | Admitting: *Deleted

## 2018-06-25 NOTE — Telephone Encounter (Signed)
Per Dr Selena BattenKim this pt can be seen on her schedule as a new patient.  I attempted to call her to inform her of this and could not leave a message due to the voicemail being full.

## 2018-08-10 MED FILL — valACYclovir HCL 1 GM TABS: 1 | 90 days supply | Qty: 90 | Fill #0

## 2018-08-10 MED FILL — SPIRONOLACTONE 50 MG TABLET: 50 | 90 days supply | Qty: 90 | Fill #0

## 2018-08-10 MED FILL — buPROPion HCL ER (XL) 300 M: 300 | 90 days supply | Qty: 90 | Fill #0

## 2018-09-11 MED FILL — buPROPion HCL ER (XL) 300 M: 300 | 90 days supply | Qty: 90 | Fill #0

## 2018-09-11 MED FILL — SPIRONOLACTONE 50 MG TABS: 50 | 90 days supply | Qty: 90 | Fill #0

## 2018-09-24 MED FILL — TRETINOIN 0.025% CREAM: 0.025 | 30 days supply | Qty: 45 | Fill #0

## 2018-10-04 MED FILL — valACYclovir HCL 1 GM TABS: 1 | 90 days supply | Qty: 90 | Fill #0

## 2018-10-18 MED FILL — SPIRONOLACTONE 100 MG TAB: 100 | 30 days supply | Qty: 30 | Fill #0

## 2018-11-12 MED FILL — TRETINOIN 0.025% CREAM: 0.025 | 30 days supply | Qty: 45 | Fill #1

## 2018-11-21 MED FILL — SPIRONOLACTONE 100 MG TAB: 100 | 30 days supply | Qty: 30 | Fill #0

## 2018-11-30 ENCOUNTER — Other Ambulatory Visit: Payer: Self-pay | Admitting: Family Medicine

## 2018-11-30 DIAGNOSIS — Z975 Presence of (intrauterine) contraceptive device: Secondary | ICD-10-CM

## 2018-11-30 DIAGNOSIS — R102 Pelvic and perineal pain: Secondary | ICD-10-CM

## 2018-12-05 ENCOUNTER — Ambulatory Visit
Admission: RE | Admit: 2018-12-05 | Discharge: 2018-12-05 | Disposition: A | Discharge status: Home / Self Care | Payer: PRIVATE HEALTH INSURANCE | Source: Ambulatory Visit | Attending: Family Medicine | Admitting: Family Medicine

## 2018-12-05 DIAGNOSIS — R102 Pelvic and perineal pain: Secondary | ICD-10-CM

## 2018-12-05 DIAGNOSIS — Z975 Presence of (intrauterine) contraceptive device: Secondary | ICD-10-CM

## 2018-12-12 MED FILL — TRETINOIN 0.05 % CREA: 0.05 | 20 days supply | Qty: 20 | Fill #0

## 2018-12-13 MED FILL — buPROPion HCL ER (XL) 300 M: 300 | 90 days supply | Qty: 90 | Fill #1

## 2018-12-15 MED FILL — SPIRONOLACTONE 100 MG TAB: 100 | 90 days supply | Qty: 90 | Fill #0

## 2019-01-04 MED FILL — valACYclovir HCL 1 GM TABS: 1 | 90 days supply | Qty: 90 | Fill #1

## 2019-01-17 MED FILL — METHOCARBAMOL 750 MG TABS: 750 | 5 days supply | Qty: 30 | Fill #0

## 2019-01-17 MED FILL — MELOXICAM 15 MG TABLET: 15 | 30 days supply | Qty: 30 | Fill #0

## 2019-02-19 MED FILL — TRETINOIN 0.05 % CREA: 0.05 | 20 days supply | Qty: 20 | Fill #1

## 2019-03-15 MED FILL — TRETINOIN 0.05 % CREA: 0.05 | 20 days supply | Qty: 20 | Fill #1

## 2019-03-15 MED FILL — buPROPion HCL ER (XL) 300 M: 300 | 90 days supply | Qty: 90 | Fill #0

## 2019-03-23 MED FILL — SPIRONOLACTONE 100 MG TAB: 100 | 90 days supply | Qty: 90 | Fill #1

## 2019-04-09 MED FILL — valACYclovir HCL 1 GM TABS: 1 | 90 days supply | Qty: 90 | Fill #0

## 2019-05-29 MED FILL — TRETINOIN 0.05 % CREA: 0.05 | 30 days supply | Qty: 20 | Fill #0

## 2019-06-11 MED FILL — BUPROPION HCL ER (XL) 300 M: 300 | 30 days supply | Qty: 30 | Fill #1

## 2019-06-18 MED FILL — SPIRONOLACTONE 100 MG TAB: 100 | 90 days supply | Qty: 90 | Fill #0

## 2019-06-21 MED FILL — TRETINOIN 0.1 % CREA: 0.1 | 30 days supply | Qty: 45 | Fill #0

## 2019-07-08 MED FILL — valACYclovir HCL 1 GM TABS: 1 | 30 days supply | Qty: 30 | Fill #1

## 2019-07-08 MED FILL — BUPROPION HCL ER (XL) 300 M: 300 | 30 days supply | Qty: 30 | Fill #2

## 2019-08-07 MED FILL — valACYclovir HCL 1 GM TABS: 1 | 30 days supply | Qty: 30 | Fill #2

## 2019-08-07 MED FILL — BUPROPION HCL XL 300 MG TAB: 300 | 30 days supply | Qty: 30 | Fill #3

## 2019-09-09 MED FILL — valACYclovir HCL 1 GM TABS: 1 | 30 days supply | Qty: 30 | Fill #3

## 2019-09-16 MED FILL — SPIRONOLACTONE 100 MG TAB: 100 | 90 days supply | Qty: 90 | Fill #0

## 2019-10-07 MED FILL — valACYclovir HCL 1 GM TABS: 1 | 90 days supply | Qty: 90 | Fill #0

## 2019-11-06 MED FILL — buPROPion HCL ER (XL) 300 M: 300 | 90 days supply | Qty: 90 | Fill #0

## 2019-12-09 MED FILL — SPIRONOLACTONE 100 MG TAB: 100 | 90 days supply | Qty: 90 | Fill #1

## 2019-12-14 ENCOUNTER — Ambulatory Visit: Payer: No Typology Code available for payment source

## 2019-12-17 ENCOUNTER — Ambulatory Visit: Payer: No Typology Code available for payment source | Attending: Internal Medicine

## 2019-12-17 ENCOUNTER — Ambulatory Visit: Payer: No Typology Code available for payment source

## 2019-12-17 DIAGNOSIS — Z23 Encounter for immunization: Secondary | ICD-10-CM

## 2019-12-17 NOTE — Progress Notes (Signed)
   Covid-19 Vaccination Clinic  Name:  ANAJULIA LEYENDECKER    MRN: [id number] DOB: [birth year]  12/17/2019  Ms. Kandler was observed post Covid-19 immunization for 15 minutes without incident. She was provided with Vaccine Information Sheet and instruction to access the V-Safe system.   Ms. Honeycutt was instructed to call 911 with any severe reactions post vaccine: Marland Kitchen Difficulty breathing  . Swelling of face and throat  . A fast heartbeat  . A bad rash all over body  . Dizziness and weakness   Immunizations Administered    Name Date Dose VIS Date Route   Pfizer COVID-19 Vaccine 12/17/2019  1:13 PM 0.3 mL 09/11/2018 Intramuscular   Manufacturer: [employer], [employer]   Lot: [id number]   NDC: [address]-7

## 2019-12-31 MED FILL — TRETINOIN 0.1 % CREA: 0.1 | 30 days supply | Qty: 45 | Fill #1

## 2020-01-09 ENCOUNTER — Ambulatory Visit: Payer: No Typology Code available for payment source | Attending: Internal Medicine

## 2020-01-13 MED FILL — valACYclovir HCL 1 GM TABS: 1 | 90 days supply | Qty: 90 | Fill #0

## 2020-02-13 ENCOUNTER — Other Ambulatory Visit: Payer: Self-pay | Admitting: Family Medicine

## 2020-02-13 ENCOUNTER — Other Ambulatory Visit (HOSPITAL_COMMUNITY): Payer: Self-pay | Admitting: Family Medicine

## 2020-02-13 ENCOUNTER — Other Ambulatory Visit: Payer: Self-pay

## 2020-02-13 ENCOUNTER — Ambulatory Visit
Admission: RE | Admit: 2020-02-13 | Discharge: 2020-02-13 | Disposition: A | Discharge status: Home / Self Care | Payer: No Typology Code available for payment source | Source: Ambulatory Visit | Attending: Family Medicine | Admitting: Family Medicine

## 2020-02-13 DIAGNOSIS — M79675 Pain in left toe(s): Secondary | ICD-10-CM

## 2020-02-13 MED FILL — ESCITALOPRAM 10 MG TABLET: 10 | 90 days supply | Qty: 90 | Fill #0

## 2020-02-13 MED FILL — buPROPion HCL ER (XL) 300 M: 300 | 90 days supply | Qty: 90 | Fill #0

## 2020-03-02 MED FILL — TRETINOIN 0.1% CREAM: 0.1 | 30 days supply | Qty: 45 | Fill #2

## 2020-03-09 MED FILL — SPIRONOLACTONE 100 MG TAB: 100 | 90 days supply | Qty: 90 | Fill #2

## 2020-04-10 ENCOUNTER — Other Ambulatory Visit (HOSPITAL_COMMUNITY): Payer: Self-pay | Admitting: Family Medicine

## 2020-04-10 MED FILL — buPROPion HCL ER (XL) 150 M: 150 | 90 days supply | Qty: 90 | Fill #0

## 2020-04-13 MED FILL — valACYclovir HCL 1 GM TABS: 1 | 90 days supply | Qty: 90 | Fill #0

## 2020-04-27 MED FILL — ESCITALOPRAM 20 MG TABLET: 20 | 90 days supply | Qty: 90 | Fill #0

## 2020-05-12 ENCOUNTER — Ambulatory Visit (INDEPENDENT_AMBULATORY_CARE_PROVIDER_SITE_OTHER): Payer: No Typology Code available for payment source | Admitting: Orthopaedic Surgery

## 2020-05-12 ENCOUNTER — Other Ambulatory Visit: Payer: Self-pay | Admitting: Physician Assistant

## 2020-05-12 ENCOUNTER — Encounter: Payer: Self-pay | Admitting: Orthopaedic Surgery

## 2020-05-12 VITALS — Ht 61.0 in | Wt 145.0 lb

## 2020-05-12 DIAGNOSIS — M2022 Hallux rigidus, left foot: Secondary | ICD-10-CM

## 2020-05-12 MED ORDER — DICLOFENAC SODIUM 1 % EX GEL: 2.0000 g | Freq: Four times a day (QID) | CUTANEOUS | 0 refills | Status: DC

## 2020-05-12 MED FILL — DICLOFENAC SODIUM 1 % GEL: 1 | 12 days supply | Qty: 100 | Fill #0

## 2020-05-12 NOTE — Progress Notes (Signed)
Office Visit Note   Patient: Paula Burke           Date of Birth: [birth year]           MRN: [id number] Visit Date: 05/12/2020              Requested by: Jon Gills, MD [address] [place],  [place] [address] PCP: Jon Gills, MD   Assessment & Plan: Visit Diagnoses:  1. Hallux rigidus, left foot     Plan: Impression is left great toe hallux rigidus.  We discussed various treatment options to include topical anti-inflammatories, stiff full foot orthotics and cortisone injections.  She would like to first try topical anti-inflammatories and a full foot stiff orthotic as well as relative rest for the next 4 to 6 weeks.  If she does not notice moderate improvement she will call us or we will refer her to Dr. Prince Rome for ultrasound-guided cortisone injection.  Follow-up with [place] as needed otherwise.  Follow-Up Instructions: Return if symptoms worsen or fail to improve.   Orders:  No orders of the defined types were placed in this encounter.  Meds ordered this encounter  Medications  . diclofenac Sodium (VOLTAREN) 1 % GEL    Sig: Apply 2 g topically 4 (four) times daily.    Dispense:  150 g    Refill:  0      Procedures: No procedures performed   Clinical Data: No additional findings.   Subjective: Chief Complaint  Patient presents with  . Left Foot - Pain    HPI patient is a pleasant 44 year old who comes in today with left great toe pain.  This began approximately 1 year after frequently jumping on a trampoline with her children during Covid.  She has had increased pain with various activities since.  All of her pain is to the MTP joint.  The pain is worse with any walking or specifically with running.  Wearing flip-flops also seem to aggravate her symptoms.  She has tried a course of anti-inflammatories provided by her PCP which did help, but was told not to take these long-term due to low GFR.  Review of Systems as detailed in HPI.  All others  reviewed and are negative.   Objective: Vital Signs: Ht 5\' 1"  (1.549 m)   Wt 145 lb (65.8 kg)   BMI 27.40 kg/m   Physical Exam well-developed well-nourished female no acute distress.  Alert and oriented x3.  Ortho Exam left great toe exam shows increased pain with dorsiflexion past about 30 degrees.  She is neurovascularly intact distally.  Specialty Comments:  No specialty comments available.  Imaging: X-rays reviewed by me in canopy of the left great toe reveal mild dorsal osteophytes to the MTP joint.  No other acute findings.   PMFS History: Patient Active Problem List   Diagnosis Date Noted  . Depression with anxiety 10/29/2011  . NSVD (normal spontaneous vaginal delivery) 10/28/2011  . Perineal laceration, second degree 10/28/2011  . Thrombocytopenia (HCC) 10/27/2011   Past Medical History:  Diagnosis Date  . Anxiety   . Depression   . H/O wisdom tooth extraction 09/2001  . Headache(784.0)   . Mental disorder   . Thrombocytopenia complicating pregnancy (HCC)   . Yeast infection     Family History  Problem Relation Age of Onset  . Cancer Maternal Grandmother        ovarian  . Stroke Maternal Grandmother   . Emphysema Paternal Grandfather   . Cancer  Paternal Grandfather        Lung    Past Surgical History:  Procedure Laterality Date  . chicken pox    . scoliosis     Social History   Occupational History  . Not on file  Tobacco Use  . Smoking status: Never Smoker  . Smokeless tobacco: Never Used  Substance and Sexual Activity  . Alcohol use: No  . Drug use: No  . Sexual activity: Yes

## 2020-06-05 ENCOUNTER — Other Ambulatory Visit (HOSPITAL_COMMUNITY): Payer: Self-pay | Admitting: Family Medicine

## 2020-06-06 MED FILL — SPIRONOLACTONE 100 MG TAB: 100 | 90 days supply | Qty: 90 | Fill #3

## 2020-06-18 ENCOUNTER — Other Ambulatory Visit (HOSPITAL_COMMUNITY): Payer: Self-pay | Admitting: General Surgery

## 2020-06-18 MED FILL — SPIRONOLACTONE 50 MG TABLET: 50 | 90 days supply | Qty: 270 | Fill #0

## 2020-06-18 MED FILL — TRETINOIN 0.1% CREAM: 0.1 | 30 days supply | Qty: 45 | Fill #0

## 2020-07-06 MED FILL — TRETINOIN 0.1% CREAM: 0.1 | 30 days supply | Qty: 45 | Fill #0

## 2020-07-06 MED FILL — buPROPion HCL ER (XL) 150 M: 150 | 30 days supply | Qty: 30 | Fill #1

## 2020-07-06 MED FILL — SPIRONOLACTONE 50 MG TABLET: 50 | 90 days supply | Qty: 270 | Fill #0

## 2020-07-06 MED FILL — valACYclovir HCL 1 GM TABS: 1 | 30 days supply | Qty: 30 | Fill #1

## 2020-07-21 ENCOUNTER — Other Ambulatory Visit (HOSPITAL_COMMUNITY): Payer: Self-pay | Admitting: General Surgery

## 2020-07-21 MED FILL — ESCITALOPRAM 20 MG TABLET: 20 | 90 days supply | Qty: 90 | Fill #0

## 2020-07-25 ENCOUNTER — Ambulatory Visit: Payer: No Typology Code available for payment source

## 2020-08-06 MED FILL — buPROPion HCL ER (XL) 150 M: 150 | 60 days supply | Qty: 60 | Fill #2

## 2020-08-07 MED FILL — valACYclovir HCL 1 GM TABS: 1 | 30 days supply | Qty: 30 | Fill #2

## 2020-09-04 MED FILL — spIRONOLACTONE 100 MG TAB: 100 | 90 days supply | Qty: 90 | Fill #0

## 2020-09-09 ENCOUNTER — Ambulatory Visit (INDEPENDENT_AMBULATORY_CARE_PROVIDER_SITE_OTHER): Payer: No Typology Code available for payment source | Admitting: Podiatry

## 2020-09-09 ENCOUNTER — Other Ambulatory Visit: Payer: Self-pay

## 2020-09-09 ENCOUNTER — Encounter: Payer: Self-pay | Admitting: Podiatry

## 2020-09-09 ENCOUNTER — Ambulatory Visit (INDEPENDENT_AMBULATORY_CARE_PROVIDER_SITE_OTHER): Payer: No Typology Code available for payment source

## 2020-09-09 DIAGNOSIS — M79675 Pain in left toe(s): Secondary | ICD-10-CM

## 2020-09-09 DIAGNOSIS — M205X2 Other deformities of toe(s) (acquired), left foot: Secondary | ICD-10-CM | POA: Diagnosis not present

## 2020-09-09 DIAGNOSIS — M779 Enthesopathy, unspecified: Secondary | ICD-10-CM | POA: Diagnosis not present

## 2020-09-09 MED ORDER — TRIAMCINOLONE ACETONIDE 10 MG/ML IJ SUSP
10.0000 mg | Freq: Once | INTRAMUSCULAR | Status: AC
  Administered 2020-09-09: 10 mg

## 2020-09-13 NOTE — Progress Notes (Signed)
Subjective:   Patient ID: Paula Burke, female   DOB: 45 y.o.   MRN: [id number]   HPI Patient presents with pain in the big toe joint left is been present for several years gradually having worsened over that time.  States that it is achy and at times throbbing depending on what type of activity she does and patient does not smoke likes to be active   Review of Systems  All other systems reviewed and are negative.       Objective:  Physical Exam Vitals and nursing note reviewed.  Constitutional:      Appearance: She is well-developed and well-nourished.  Cardiovascular:     Pulses: Intact distal pulses.  Pulmonary:     Effort: Pulmonary effort is normal.  Musculoskeletal:        General: Normal range of motion.  Skin:    General: Skin is warm.  Neurological:     Mental Status: She is alert.     Neurovascular status intact muscle strength found to be adequate range of motion adequate.  Patient is noted to have inflammation heard over the left first MPJ with fluid buildup around the joint surface with no crepitus or significant restriction of motion of the joint.  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  Patient is noted to have functional hallux limitus and x-rays were reviewed with patient.  Today I did sterile prep injected the joint 3 mg dexamethasone Kenalog 5 mg Xylocaine advised on rigid bottom shoes reappoint 3 weeks  X-rays indicate that there is no significant signs of advanced arthritic processes around the joint surface

## 2020-09-14 MED FILL — valACYclovir HCL 1 GM TABS: 1 | 30 days supply | Qty: 30 | Fill #3

## 2020-09-30 ENCOUNTER — Other Ambulatory Visit: Payer: Self-pay

## 2020-09-30 ENCOUNTER — Encounter: Payer: Self-pay | Admitting: Podiatry

## 2020-09-30 ENCOUNTER — Ambulatory Visit (INDEPENDENT_AMBULATORY_CARE_PROVIDER_SITE_OTHER): Payer: No Typology Code available for payment source | Admitting: Podiatry

## 2020-09-30 DIAGNOSIS — M205X2 Other deformities of toe(s) (acquired), left foot: Secondary | ICD-10-CM | POA: Diagnosis not present

## 2020-09-30 NOTE — Progress Notes (Signed)
Subjective:   Patient ID: Paula Burke, female   DOB: 45 y.o.   MRN: [id number]   HPI Patient presents stating she had several weeks of relief but has had reoccurrence of discomfort in the left first MPJ patient does wear a graphite insole to try to stop motion   ROS      Objective:  Physical Exam  Continue to hallux limitus deformity left with inflammation     Assessment:  Pain around the first MPJ noted still with crepitus when I dorsiflex the toe H&P done     Plan:  H&P done reviewed and I went ahead today and I discussed her x-ray and different treatment options.  Due to the fact is been present for a number of years and only got better for a couple weeks and is keeping her from being active I recommended a biplanar type osteotomy but did explain a great length no guarantee this will solve the problem.  Patient wants surgery is willing to accept risk and was given consent form which she reviewed understanding all risk as outlined.  Patient is scheduled outpatient surgery encouraged to call questions concerns and understands total recovery can take 6 months to 1 year and I absolutely not cannot guarantee that this will solve the problem.  I dispensed air fracture walker today that I want her to get used to prior to the procedure and find other shoes that are comfortable on the other foot

## 2020-10-09 ENCOUNTER — Other Ambulatory Visit (HOSPITAL_COMMUNITY): Payer: Self-pay | Admitting: Family Medicine

## 2020-10-09 MED FILL — buPROPion HCL ER (XL) 150 M: 150 | 90 days supply | Qty: 90 | Fill #0

## 2020-10-09 MED FILL — valACYclovir HCL 1 GM TABS: 1 | 90 days supply | Qty: 90 | Fill #0

## 2020-10-09 MED FILL — ESCITALOPRAM 20 MG TABLET: 20 | 90 days supply | Qty: 90 | Fill #1

## 2020-11-15 HISTORY — PX: OSTEOTOMY: SHX137

## 2020-11-23 ENCOUNTER — Other Ambulatory Visit (HOSPITAL_COMMUNITY): Payer: Self-pay

## 2020-11-23 ENCOUNTER — Encounter: Payer: No Typology Code available for payment source | Admitting: Podiatry

## 2020-11-23 MED ORDER — SPIRONOLACTONE 100 MG PO TABS
100.0000 mg | ORAL_TABLET | Freq: Every day | ORAL | 0 refills | Status: DC
  Filled 2020-11-23: qty 90, 90d supply, fill #0

## 2020-11-27 ENCOUNTER — Other Ambulatory Visit (HOSPITAL_COMMUNITY): Payer: Self-pay

## 2020-11-27 MED ORDER — SPIRONOLACTONE 50 MG PO TABS
ORAL_TABLET | ORAL | 0 refills | Status: DC
  Filled 2021-01-27: qty 270, 90d supply, fill #0
  Filled 2021-05-10: qty 270, 90d supply, fill #1

## 2020-11-30 ENCOUNTER — Telehealth: Payer: Self-pay | Admitting: Urology

## 2020-11-30 NOTE — Telephone Encounter (Addendum)
DOS - 12/08/20  [place] BUNIONECTOMY LEFT- 28296   [place] FOCUS EFFECTIVE DATE - 07/18/20   PLAN DEDUCTIBLE - $0.00 W/ $0.00 REMAINING OUT OF POCKET - $2,500.00 W/ $1,961.27 REMAINING  COINSURANCE - 0% COPAY - $750  SPOKE WITH DENISE WITH [facility] AND SHE STATED THAT FOR CPT 28296 NO PRIOR AUTH IS REQUIRED.  REF #[id number]    Received fax stating cpt code 27062 has been approved, auth # 1 - [id number] and good from 11/30/20 - 12/30/20.

## 2020-12-07 ENCOUNTER — Other Ambulatory Visit (HOSPITAL_COMMUNITY): Payer: Self-pay

## 2020-12-07 MED ORDER — OXYCODONE-ACETAMINOPHEN 10-325 MG PO TABS
1.0000 | ORAL_TABLET | ORAL | 0 refills | Status: DC | PRN
  Filled 2020-12-07: qty 25, 4d supply, fill #0

## 2020-12-07 MED ORDER — ONDANSETRON HCL 4 MG PO TABS
4.0000 mg | ORAL_TABLET | Freq: Three times a day (TID) | ORAL | 0 refills | Status: DC | PRN
  Filled 2020-12-07: qty 20, 7d supply, fill #0

## 2020-12-07 NOTE — Addendum Note (Signed)
Addended by: Lenn Sink on: 12/07/2020 01:17 PM   Modules accepted: Orders

## 2020-12-08 ENCOUNTER — Encounter: Payer: Self-pay | Admitting: Podiatry

## 2020-12-08 DIAGNOSIS — M2022 Hallux rigidus, left foot: Secondary | ICD-10-CM | POA: Diagnosis not present

## 2020-12-12 ENCOUNTER — Telehealth: Payer: Self-pay | Admitting: Podiatry

## 2020-12-12 NOTE — Telephone Encounter (Signed)
Patient called 12/11/2020 around 8pm stating that she had a bad headache which has worsened today. She is not sure if the pain medication is causing it and asking if she can take ibuprofen with the pain medication. She does have a history of a "low GFR". Due to this I recommended only taking ibuprofen for a very short time and to take this today to see if it helps. Also she can go ahead and take a Zofran as she is somewhat nauseous. She has no chest pain, SOB, weakness or other issues. Informed her that if it does not get any better to go to the ER/urgent care. Also encouraged her to stay hydrated.   I tried to call her back today 12/12/2020 at 10:15am to see how she was feeling. Left VM to call back if she had any questions or concerns.

## 2020-12-15 ENCOUNTER — Other Ambulatory Visit (HOSPITAL_BASED_OUTPATIENT_CLINIC_OR_DEPARTMENT_OTHER): Payer: Self-pay

## 2020-12-15 MED ORDER — CARESTART COVID-19 HOME TEST VI KIT
PACK | 0 refills | Status: AC
  Filled 2020-12-15: qty 4, 8d supply, fill #0

## 2020-12-16 ENCOUNTER — Ambulatory Visit (INDEPENDENT_AMBULATORY_CARE_PROVIDER_SITE_OTHER): Payer: No Typology Code available for payment source

## 2020-12-16 ENCOUNTER — Encounter: Payer: Self-pay | Admitting: Podiatry

## 2020-12-16 ENCOUNTER — Ambulatory Visit (INDEPENDENT_AMBULATORY_CARE_PROVIDER_SITE_OTHER): Payer: No Typology Code available for payment source | Admitting: Podiatry

## 2020-12-16 ENCOUNTER — Other Ambulatory Visit: Payer: Self-pay

## 2020-12-16 DIAGNOSIS — R41840 Attention and concentration deficit: Secondary | ICD-10-CM | POA: Insufficient documentation

## 2020-12-16 DIAGNOSIS — N1831 Chronic kidney disease, stage 3a: Secondary | ICD-10-CM | POA: Insufficient documentation

## 2020-12-16 DIAGNOSIS — G5600 Carpal tunnel syndrome, unspecified upper limb: Secondary | ICD-10-CM | POA: Insufficient documentation

## 2020-12-16 DIAGNOSIS — F329 Major depressive disorder, single episode, unspecified: Secondary | ICD-10-CM | POA: Insufficient documentation

## 2020-12-16 DIAGNOSIS — M205X2 Other deformities of toe(s) (acquired), left foot: Secondary | ICD-10-CM | POA: Diagnosis not present

## 2020-12-16 DIAGNOSIS — G4710 Hypersomnia, unspecified: Secondary | ICD-10-CM | POA: Insufficient documentation

## 2020-12-16 DIAGNOSIS — F324 Major depressive disorder, single episode, in partial remission: Secondary | ICD-10-CM | POA: Insufficient documentation

## 2020-12-16 DIAGNOSIS — F419 Anxiety disorder, unspecified: Secondary | ICD-10-CM | POA: Insufficient documentation

## 2020-12-17 NOTE — Progress Notes (Signed)
Subjective:   Patient ID: Paula Burke, female   DOB: 45 y.o.   MRN: [id number]   HPI Patient is doing really well with surgery and pleased so far with how everything feels   ROS      Objective:  Physical Exam  Neurovascular status intact negative Denna Haggard' sign noted wound edges left healed well good alignment noted incision healing well     Assessment:  Doing well post osteotomy first metatarsal left foot     Plan:  H&P reviewed condition recommended continued immobilization elevation compression with dressing reapplied today.  Discussed range of motion exercises reappoint 3 weeks or earlier if needed  X-rays indicate osteotomies healing well good alignment noted

## 2020-12-24 ENCOUNTER — Other Ambulatory Visit (HOSPITAL_COMMUNITY): Payer: Self-pay

## 2020-12-24 MED ORDER — AMPHETAMINE-DEXTROAMPHET ER 15 MG PO CP24
ORAL_CAPSULE | ORAL | 0 refills | Status: DC
  Filled 2020-12-24: qty 30, 30d supply, fill #0

## 2020-12-25 ENCOUNTER — Telehealth: Payer: Self-pay | Admitting: *Deleted

## 2020-12-25 NOTE — Telephone Encounter (Signed)
Patient is calling and said that the surgical shoe given 1 1/2 weeks ago may be have been too large,causing her more pain with walking,feeling awkward and strange.   Returned call to patient and instructed her that she may come in and switch out, can ask for Dr Beverlee Nims or floor nurse.  She said that she will be in at 2:00 today.

## 2020-12-28 ENCOUNTER — Encounter: Payer: No Typology Code available for payment source | Admitting: Podiatry

## 2021-01-04 ENCOUNTER — Ambulatory Visit (INDEPENDENT_AMBULATORY_CARE_PROVIDER_SITE_OTHER): Payer: No Typology Code available for payment source | Admitting: Podiatry

## 2021-01-04 ENCOUNTER — Encounter: Payer: Self-pay | Admitting: Podiatry

## 2021-01-04 ENCOUNTER — Other Ambulatory Visit: Payer: Self-pay

## 2021-01-04 ENCOUNTER — Ambulatory Visit (INDEPENDENT_AMBULATORY_CARE_PROVIDER_SITE_OTHER): Payer: No Typology Code available for payment source

## 2021-01-04 DIAGNOSIS — M205X2 Other deformities of toe(s) (acquired), left foot: Secondary | ICD-10-CM

## 2021-01-04 NOTE — Progress Notes (Signed)
Subjective:   Patient ID: Paula Burke, female   DOB: 45 y.o.   MRN: [id number]   HPI Patient states doing very well with her left foot very pleased   ROS      Objective:  Physical Exam  Neurovascular status intact negative Denna Haggard' sign noted wound edges well coapted left good alignment noted     Assessment:  Doing well post distal osteotomy left first metatarsal     Plan:  H&P x-ray reviewed advised on gradual return to soft shoe gear compression ankle stocking given and encouraged continued range of motion and reappoint 4 weeks earlier if needed  X-rays indicate osteotomies healing well good alignment noted no signs of pathology

## 2021-01-06 ENCOUNTER — Encounter: Payer: No Typology Code available for payment source | Admitting: Podiatry

## 2021-01-12 ENCOUNTER — Other Ambulatory Visit (HOSPITAL_COMMUNITY): Payer: Self-pay

## 2021-01-12 MED ORDER — VALACYCLOVIR HCL 1 G PO TABS
ORAL_TABLET | ORAL | 0 refills | Status: DC
  Filled 2021-01-12 – 2021-01-15 (×2): qty 90, 90d supply, fill #0

## 2021-01-12 MED FILL — Bupropion HCl Tab ER 24HR 150 MG: ORAL | 90 days supply | Qty: 90 | Fill #0 | Status: CN

## 2021-01-15 ENCOUNTER — Other Ambulatory Visit (HOSPITAL_COMMUNITY): Payer: Self-pay

## 2021-01-15 ENCOUNTER — Other Ambulatory Visit (HOSPITAL_BASED_OUTPATIENT_CLINIC_OR_DEPARTMENT_OTHER): Payer: Self-pay

## 2021-01-15 MED FILL — Bupropion HCl Tab ER 24HR 150 MG: ORAL | 90 days supply | Qty: 90 | Fill #0 | Status: AC

## 2021-01-21 ENCOUNTER — Other Ambulatory Visit (HOSPITAL_BASED_OUTPATIENT_CLINIC_OR_DEPARTMENT_OTHER): Payer: Self-pay

## 2021-01-21 MED ORDER — AMPHETAMINE-DEXTROAMPHET ER 20 MG PO CP24
ORAL_CAPSULE | ORAL | 0 refills | Status: DC
  Filled 2021-01-21: qty 30, 30d supply, fill #0

## 2021-01-24 IMAGING — US US PELVIS COMPLETE WITH TRANSVAGINAL
1 series · 13 of 25 positions shown · non-contrast
Comparison: None

CLINICAL DATA: Initial evaluation for pelvic cramping, check IUD
placement.



[Series 1: us pelvis complete with transvaginal · 0.20mm/px · 13 of 78 slices shown]
[im 1/78]
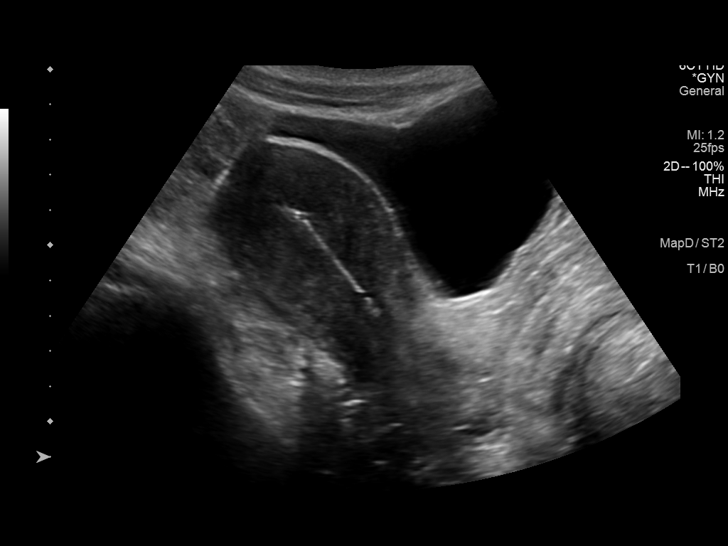
[im 7/78]
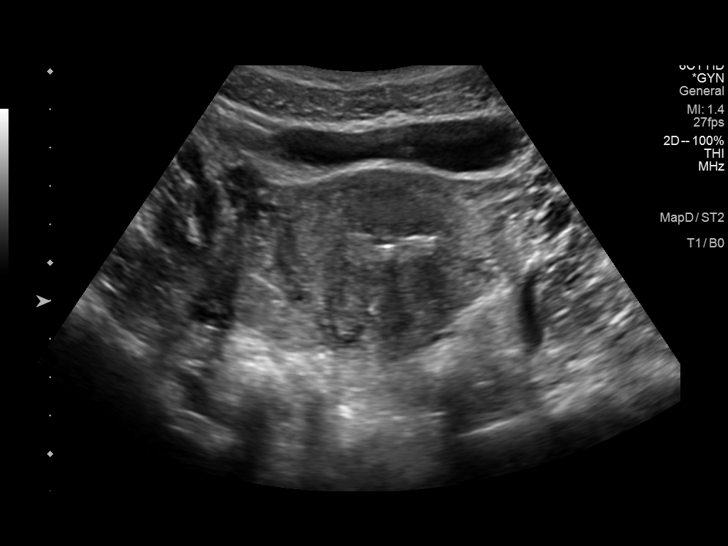
[im 13/78]
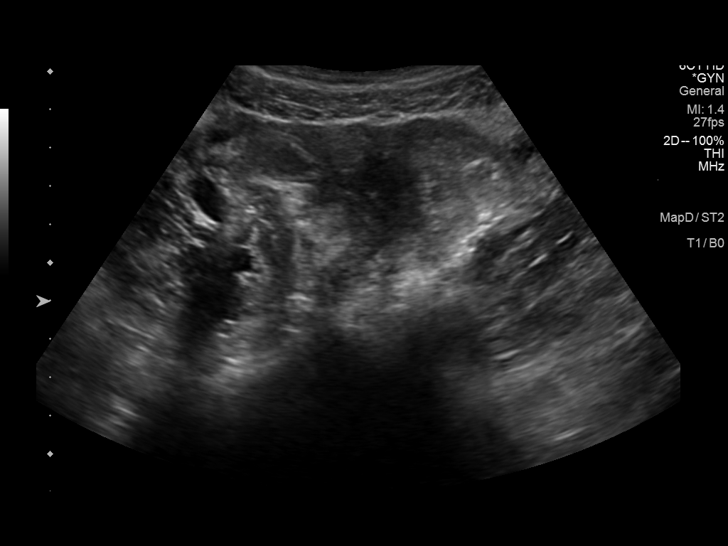
[im 20/78]
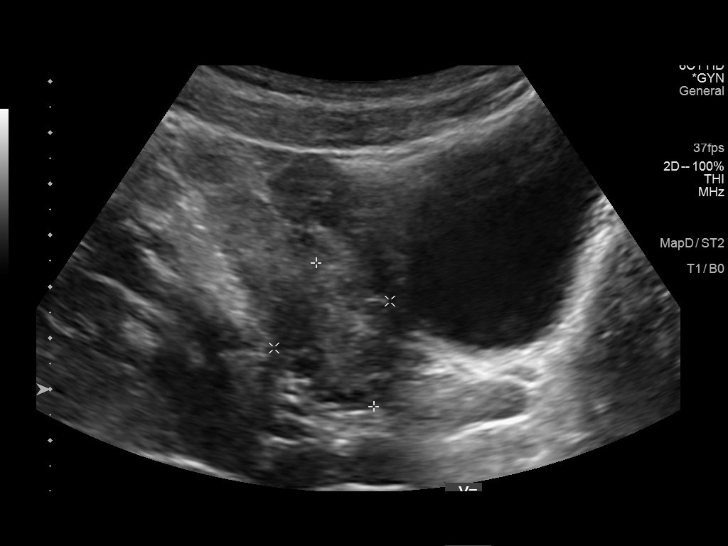
[im 26/78]
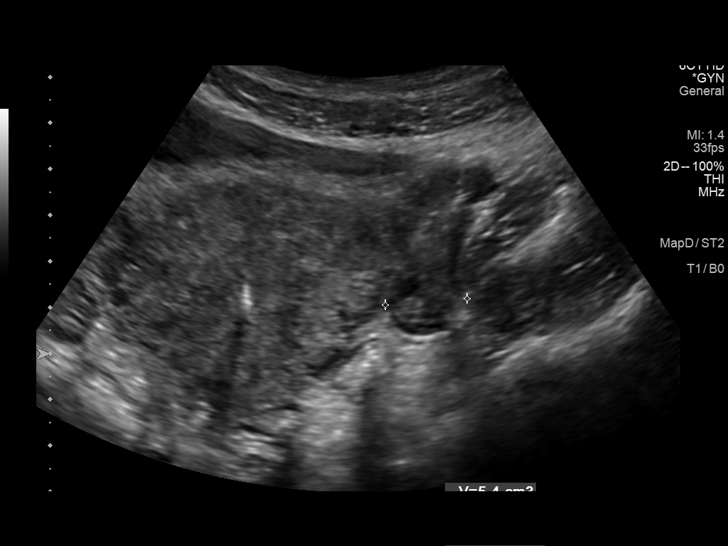
[im 33/78]
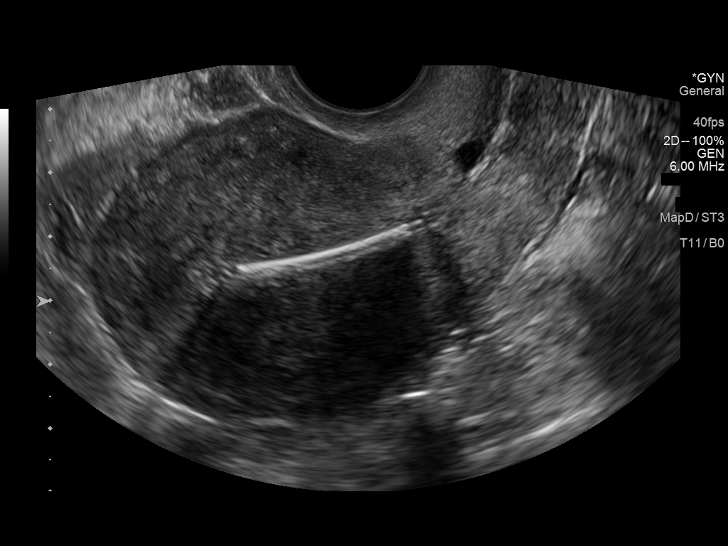
[im 39/78]
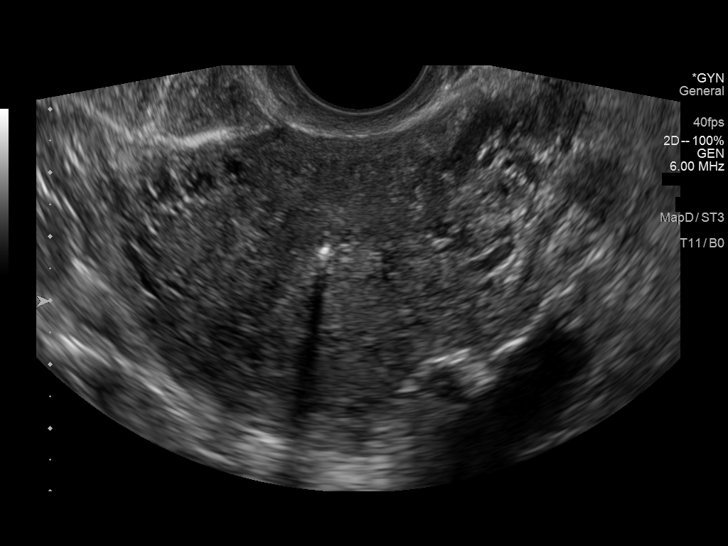
[im 45/78]
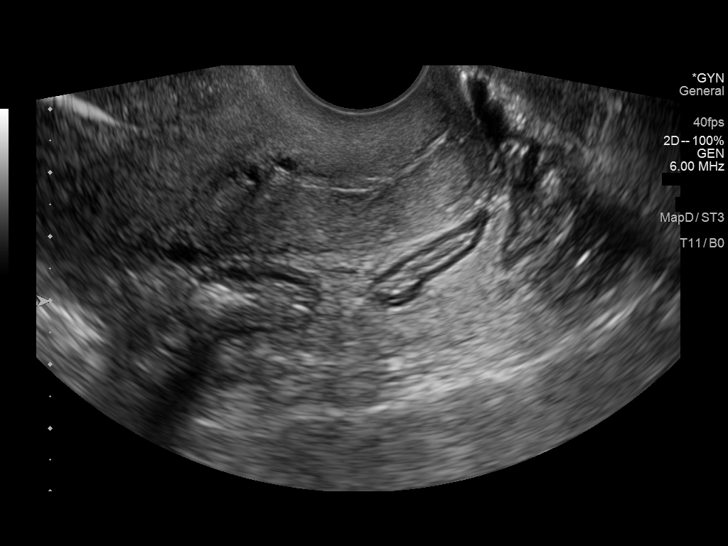
[im 52/78]
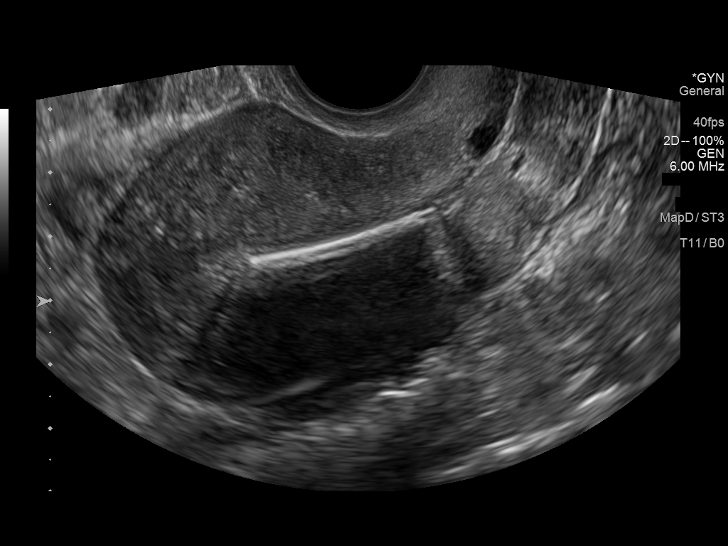
[im 58/78]
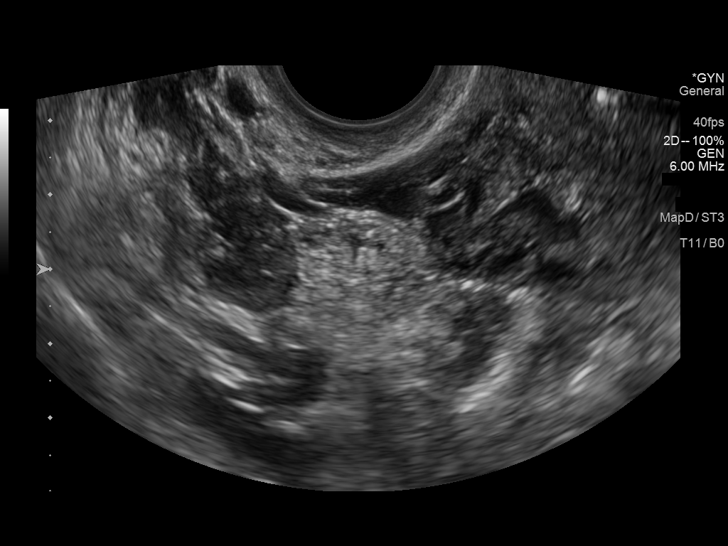
[im 65/78]
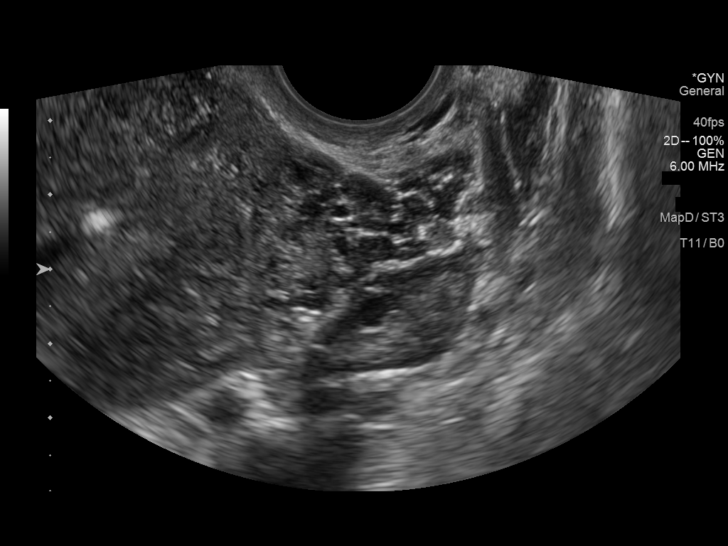
[im 71/78]
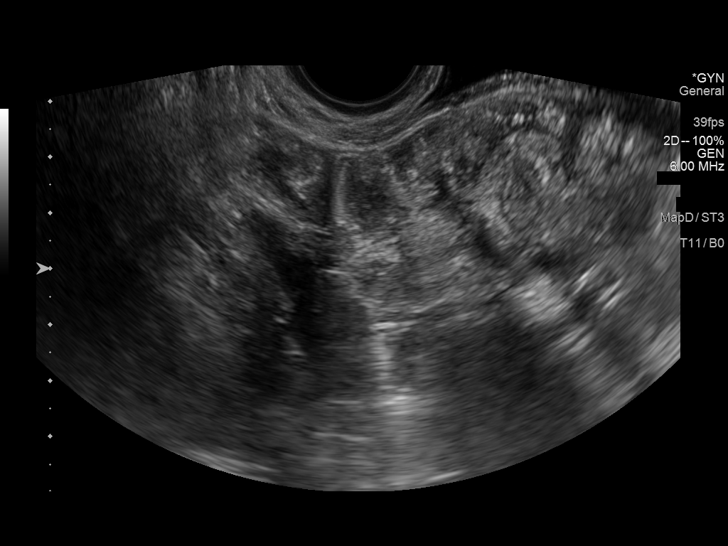
[im 78/78]
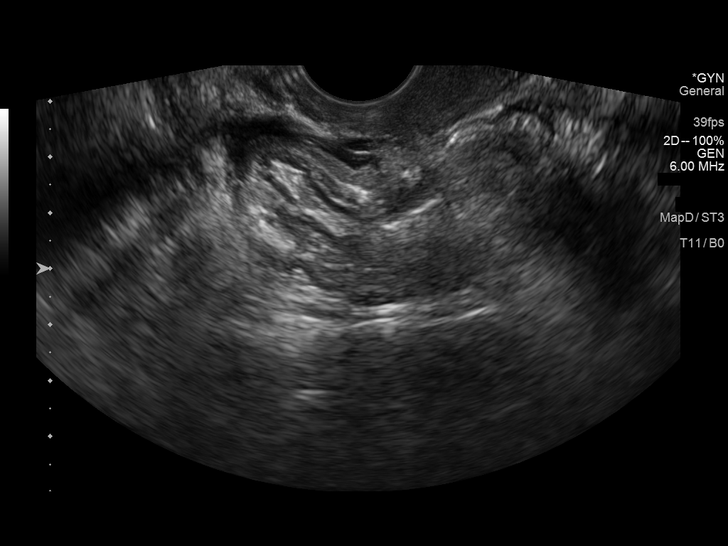

[13 of 25 positions shown; findings below may reference images not displayed]

FINDINGS: Uterus

Measurements: 11.3 x 4.4 x 5.7 cm = volume: 147.5 mL. No fibroids or
other mass visualized.

Endometrium

Thickness: 6.5 mm. No focal abnormality visualized. IUD in
appropriate position within the endometrial cavity at the level of
the uterine fundus/body.

Right ovary

Measurements: 3.2 x 1.4 x 1.6 cm = volume: 3.5 mL. Normal
appearance/no adnexal mass.

Left ovary

Measurements: 2.9 x 1.2 x 1.7 cm = volume: 3.1 mL. Normal
appearance/no adnexal mass.

Other findings

Trace free fluid within the pelvis, presumably physiologic.
IMPRESSION: 1. IUD in appropriate position within the endometrial cavity.
2. Otherwise unremarkable and normal pelvic ultrasound.

## 2021-01-27 ENCOUNTER — Other Ambulatory Visit (HOSPITAL_COMMUNITY): Payer: Self-pay

## 2021-01-27 ENCOUNTER — Other Ambulatory Visit (HOSPITAL_BASED_OUTPATIENT_CLINIC_OR_DEPARTMENT_OTHER): Payer: Self-pay

## 2021-01-27 MED FILL — Escitalopram Oxalate Tab 20 MG (Base Equiv): ORAL | 90 days supply | Qty: 90 | Fill #0 | Status: AC

## 2021-01-28 ENCOUNTER — Other Ambulatory Visit (HOSPITAL_COMMUNITY): Payer: Self-pay

## 2021-02-01 ENCOUNTER — Ambulatory Visit (INDEPENDENT_AMBULATORY_CARE_PROVIDER_SITE_OTHER): Payer: No Typology Code available for payment source

## 2021-02-01 ENCOUNTER — Ambulatory Visit (INDEPENDENT_AMBULATORY_CARE_PROVIDER_SITE_OTHER): Payer: No Typology Code available for payment source | Admitting: Podiatry

## 2021-02-01 ENCOUNTER — Other Ambulatory Visit: Payer: Self-pay

## 2021-02-01 ENCOUNTER — Encounter: Payer: Self-pay | Admitting: Podiatry

## 2021-02-01 DIAGNOSIS — Z9889 Other specified postprocedural states: Secondary | ICD-10-CM

## 2021-02-01 DIAGNOSIS — M205X2 Other deformities of toe(s) (acquired), left foot: Secondary | ICD-10-CM

## 2021-02-01 NOTE — Progress Notes (Signed)
Subjective:   Patient ID: Paula Burke, female   DOB: 45 y.o.   MRN: [id number]   HPI Patient states overall doing well states though she still gets some swelling if she has been on it too long    ROS      Objective:  Physical Exam  Neurovascular status intact negative Denna Haggard' sign noted wound edges well coapted mild swelling still noted in the foot which seems to be more consistent with the healing process but overall doing well with excellent range of motion     Assessment:  Appears to be healing well with moderate swelling consistent with this.  Postop     Plan:  H&P reviewed condition recommended the continuation of range of motion exercises anti-inflammatories and I do not want her doing any jumping on her foot as I think there still is healing of the bone that is occurring.  I did explain there may be some compression of the joint but I will keep an eye on it and I do not think it will affect her long-term but we will watch it  X-rays indicate there is some stress on the osteotomy site itself fixation is holding and good correction with the joint congruence will need to be watched over the next couple months and she is will continue not to do any excessive exercise

## 2021-02-16 ENCOUNTER — Other Ambulatory Visit (HOSPITAL_COMMUNITY): Payer: Self-pay

## 2021-02-16 MED ORDER — ADDERALL XR 20 MG PO CP24
20.0000 mg | ORAL_CAPSULE | Freq: Every morning | ORAL | 0 refills | Status: DC
  Filled 2021-02-20: qty 7, 7d supply, fill #0

## 2021-02-20 ENCOUNTER — Other Ambulatory Visit (HOSPITAL_COMMUNITY): Payer: Self-pay

## 2021-02-26 ENCOUNTER — Other Ambulatory Visit (HOSPITAL_COMMUNITY)
Admission: RE | Admit: 2021-02-26 | Discharge: 2021-02-26 | Disposition: A | Discharge status: Home / Self Care | Payer: No Typology Code available for payment source | Source: Ambulatory Visit | Attending: Family Medicine | Admitting: Family Medicine

## 2021-02-26 ENCOUNTER — Other Ambulatory Visit (HOSPITAL_COMMUNITY): Payer: Self-pay

## 2021-02-26 ENCOUNTER — Other Ambulatory Visit: Payer: Self-pay | Admitting: Family Medicine

## 2021-02-26 ENCOUNTER — Other Ambulatory Visit (HOSPITAL_BASED_OUTPATIENT_CLINIC_OR_DEPARTMENT_OTHER): Payer: Self-pay

## 2021-02-26 DIAGNOSIS — Z124 Encounter for screening for malignant neoplasm of cervix: Secondary | ICD-10-CM | POA: Insufficient documentation

## 2021-02-26 MED ORDER — VALACYCLOVIR HCL 1 G PO TABS
ORAL_TABLET | ORAL | 3 refills | Status: DC
  Filled 2021-02-26 – 2021-04-14 (×2): qty 90, 90d supply, fill #0
  Filled 2021-06-29: qty 90, 90d supply, fill #1
  Filled 2021-10-10: qty 90, 90d supply, fill #2
  Filled 2021-12-31: qty 90, 90d supply, fill #3

## 2021-02-26 MED ORDER — AMPHETAMINE-DEXTROAMPHET ER 25 MG PO CP24
25.0000 mg | ORAL_CAPSULE | Freq: Every morning | ORAL | 0 refills | Status: DC
  Filled 2021-02-26: qty 14, 14d supply, fill #0

## 2021-02-26 MED ORDER — AMPHETAMINE-DEXTROAMPHET ER 25 MG PO CP24
ORAL_CAPSULE | ORAL | 0 refills | Status: DC
  Filled 2021-02-26: qty 14, 14d supply, fill #0

## 2021-02-26 MED ORDER — BUPROPION HCL ER (XL) 150 MG PO TB24
ORAL_TABLET | ORAL | 2 refills | Status: DC
  Filled 2021-02-26 – 2021-04-14 (×2): qty 90, 90d supply, fill #0

## 2021-02-26 MED ORDER — ESCITALOPRAM OXALATE 20 MG PO TABS
ORAL_TABLET | ORAL | 1 refills | Status: DC
  Filled 2021-02-26 – 2021-04-22 (×2): qty 90, 90d supply, fill #0
  Filled 2021-07-13: qty 90, 90d supply, fill #1

## 2021-03-02 LAB — CYTOLOGY - PAP
Comment: NEGATIVE
Diagnosis: NEGATIVE
High risk HPV: NEGATIVE

## 2021-03-08 ENCOUNTER — Encounter: Payer: Self-pay | Admitting: Podiatry

## 2021-03-08 ENCOUNTER — Ambulatory Visit (INDEPENDENT_AMBULATORY_CARE_PROVIDER_SITE_OTHER): Payer: No Typology Code available for payment source

## 2021-03-08 ENCOUNTER — Other Ambulatory Visit: Payer: Self-pay

## 2021-03-08 ENCOUNTER — Ambulatory Visit (INDEPENDENT_AMBULATORY_CARE_PROVIDER_SITE_OTHER): Payer: No Typology Code available for payment source | Admitting: Podiatry

## 2021-03-08 DIAGNOSIS — Z9889 Other specified postprocedural states: Secondary | ICD-10-CM | POA: Diagnosis not present

## 2021-03-09 NOTE — Progress Notes (Signed)
Subjective:   Patient ID: Paula Burke, female   DOB: 45 y.o.   MRN: [id number]   HPI Patient states that she is doing well with her left foot and is pleased but wants to check her x-ray and is ready to return to normal activities no   ROS      Objective:  Physical Exam  Neurovascular status intact negative Denna Haggard' sign noted wound edges well coapted good range of motion no crepitus of the joint noted and good strength of the flexor extensor tendon     Assessment:  Well post osteotomy first metatarsal left foot     Plan:  H&P final x-rays reviewed allow patient to return to normal activity.  Patient is encouraged to call questions concerns which may arise  X-rays indicate osteotomies healing well fixation in place joint congruence

## 2021-03-11 ENCOUNTER — Other Ambulatory Visit (HOSPITAL_BASED_OUTPATIENT_CLINIC_OR_DEPARTMENT_OTHER): Payer: Self-pay

## 2021-03-11 MED ORDER — AMPHETAMINE-DEXTROAMPHET ER 30 MG PO CP24
ORAL_CAPSULE | ORAL | 0 refills | Status: DC
  Filled 2021-03-11: qty 14, 14d supply, fill #0

## 2021-03-25 ENCOUNTER — Other Ambulatory Visit (HOSPITAL_BASED_OUTPATIENT_CLINIC_OR_DEPARTMENT_OTHER): Payer: Self-pay

## 2021-03-25 MED ORDER — AMPHETAMINE-DEXTROAMPHET ER 30 MG PO CP24
ORAL_CAPSULE | ORAL | 0 refills | Status: DC
  Filled 2021-03-25: qty 14, 14d supply, fill #0

## 2021-04-07 ENCOUNTER — Other Ambulatory Visit (HOSPITAL_BASED_OUTPATIENT_CLINIC_OR_DEPARTMENT_OTHER): Payer: Self-pay

## 2021-04-07 MED ORDER — AMPHETAMINE-DEXTROAMPHET ER 20 MG PO CP24
ORAL_CAPSULE | ORAL | 0 refills | Status: DC
  Filled 2021-04-07: qty 60, 30d supply, fill #0

## 2021-04-08 ENCOUNTER — Other Ambulatory Visit (HOSPITAL_BASED_OUTPATIENT_CLINIC_OR_DEPARTMENT_OTHER): Payer: Self-pay

## 2021-04-12 ENCOUNTER — Other Ambulatory Visit (HOSPITAL_BASED_OUTPATIENT_CLINIC_OR_DEPARTMENT_OTHER): Payer: Self-pay

## 2021-04-14 ENCOUNTER — Other Ambulatory Visit (HOSPITAL_BASED_OUTPATIENT_CLINIC_OR_DEPARTMENT_OTHER): Payer: Self-pay

## 2021-04-22 ENCOUNTER — Other Ambulatory Visit (HOSPITAL_BASED_OUTPATIENT_CLINIC_OR_DEPARTMENT_OTHER): Payer: Self-pay

## 2021-05-04 ENCOUNTER — Other Ambulatory Visit (HOSPITAL_BASED_OUTPATIENT_CLINIC_OR_DEPARTMENT_OTHER): Payer: Self-pay

## 2021-05-04 MED ORDER — ESCITALOPRAM OXALATE 20 MG PO TABS
ORAL_TABLET | ORAL | 1 refills | Status: DC
  Filled 2021-05-04: qty 90, 90d supply, fill #0

## 2021-05-04 MED ORDER — AMPHETAMINE-DEXTROAMPHET ER 25 MG PO CP24
ORAL_CAPSULE | ORAL | 0 refills | Status: DC
  Filled 2021-05-04: qty 60, 30d supply, fill #0

## 2021-05-04 MED ORDER — AMPHETAMINE-DEXTROAMPHETAMINE 5 MG PO TABS
ORAL_TABLET | ORAL | 0 refills | Status: DC
  Filled 2021-05-04: qty 45, 30d supply, fill #0

## 2021-05-04 MED ORDER — BUPROPION HCL ER (XL) 150 MG PO TB24
ORAL_TABLET | ORAL | 2 refills | Status: DC
  Filled 2021-05-04 – 2021-06-29 (×2): qty 90, 90d supply, fill #0
  Filled 2021-10-16: qty 90, 90d supply, fill #1

## 2021-05-10 ENCOUNTER — Other Ambulatory Visit (HOSPITAL_COMMUNITY): Payer: Self-pay

## 2021-06-02 ENCOUNTER — Other Ambulatory Visit (HOSPITAL_BASED_OUTPATIENT_CLINIC_OR_DEPARTMENT_OTHER): Payer: Self-pay

## 2021-06-02 MED ORDER — VYVANSE 60 MG PO CAPS
ORAL_CAPSULE | ORAL | 0 refills | Status: DC
  Filled 2021-06-02: qty 30, 30d supply, fill #0

## 2021-06-21 ENCOUNTER — Encounter (HOSPITAL_BASED_OUTPATIENT_CLINIC_OR_DEPARTMENT_OTHER): Payer: Self-pay

## 2021-06-21 DIAGNOSIS — R0683 Snoring: Secondary | ICD-10-CM

## 2021-06-21 DIAGNOSIS — R5383 Other fatigue: Secondary | ICD-10-CM

## 2021-06-21 DIAGNOSIS — G4710 Hypersomnia, unspecified: Secondary | ICD-10-CM

## 2021-06-23 ENCOUNTER — Other Ambulatory Visit (HOSPITAL_COMMUNITY): Payer: Self-pay

## 2021-06-23 ENCOUNTER — Other Ambulatory Visit (HOSPITAL_BASED_OUTPATIENT_CLINIC_OR_DEPARTMENT_OTHER): Payer: Self-pay

## 2021-06-23 MED ORDER — MELOXICAM 15 MG PO TABS
ORAL_TABLET | ORAL | 0 refills | Status: DC
  Filled 2021-06-23: qty 30, 30d supply, fill #0

## 2021-06-29 ENCOUNTER — Other Ambulatory Visit (HOSPITAL_BASED_OUTPATIENT_CLINIC_OR_DEPARTMENT_OTHER): Payer: Self-pay

## 2021-06-29 ENCOUNTER — Encounter (HOSPITAL_BASED_OUTPATIENT_CLINIC_OR_DEPARTMENT_OTHER): Payer: No Typology Code available for payment source | Admitting: Internal Medicine

## 2021-06-30 ENCOUNTER — Other Ambulatory Visit (HOSPITAL_BASED_OUTPATIENT_CLINIC_OR_DEPARTMENT_OTHER): Payer: Self-pay

## 2021-06-30 MED ORDER — VYVANSE 60 MG PO CAPS
ORAL_CAPSULE | ORAL | 0 refills | Status: DC
  Filled 2021-06-30: qty 30, 30d supply, fill #0

## 2021-07-08 ENCOUNTER — Encounter (HOSPITAL_BASED_OUTPATIENT_CLINIC_OR_DEPARTMENT_OTHER): Payer: No Typology Code available for payment source | Admitting: Internal Medicine

## 2021-07-10 ENCOUNTER — Encounter (HOSPITAL_BASED_OUTPATIENT_CLINIC_OR_DEPARTMENT_OTHER): Payer: No Typology Code available for payment source | Admitting: Internal Medicine

## 2021-07-13 ENCOUNTER — Other Ambulatory Visit (HOSPITAL_BASED_OUTPATIENT_CLINIC_OR_DEPARTMENT_OTHER): Payer: Self-pay

## 2021-07-16 ENCOUNTER — Other Ambulatory Visit (HOSPITAL_BASED_OUTPATIENT_CLINIC_OR_DEPARTMENT_OTHER): Payer: Self-pay

## 2021-07-21 ENCOUNTER — Other Ambulatory Visit (HOSPITAL_BASED_OUTPATIENT_CLINIC_OR_DEPARTMENT_OTHER): Payer: Self-pay

## 2021-08-23 ENCOUNTER — Other Ambulatory Visit (HOSPITAL_BASED_OUTPATIENT_CLINIC_OR_DEPARTMENT_OTHER): Payer: Self-pay

## 2021-08-23 ENCOUNTER — Encounter (HOSPITAL_BASED_OUTPATIENT_CLINIC_OR_DEPARTMENT_OTHER): Payer: Self-pay | Admitting: Pharmacist

## 2021-08-23 MED ORDER — VYVANSE 50 MG PO CAPS
ORAL_CAPSULE | ORAL | 0 refills | Status: DC
  Filled 2021-08-23: qty 30, 30d supply, fill #0

## 2021-08-25 ENCOUNTER — Other Ambulatory Visit (HOSPITAL_BASED_OUTPATIENT_CLINIC_OR_DEPARTMENT_OTHER): Payer: Self-pay

## 2021-08-25 MED ORDER — TRETINOIN 0.1 % EX CREA
TOPICAL_CREAM | CUTANEOUS | 3 refills | Status: DC
  Filled 2021-08-25: qty 45, 30d supply, fill #0
  Filled 2022-04-11: qty 45, 30d supply, fill #1
  Filled 2022-07-10: qty 45, 30d supply, fill #2
  Filled 2022-08-21: qty 45, 30d supply, fill #3

## 2021-08-25 MED ORDER — SPIRONOLACTONE 50 MG PO TABS
ORAL_TABLET | ORAL | 3 refills | Status: AC
  Filled 2021-08-25: qty 250, 83d supply, fill #0
  Filled 2021-11-10: qty 270, 90d supply, fill #1
  Filled 2022-02-13: qty 270, 90d supply, fill #2

## 2021-08-26 ENCOUNTER — Other Ambulatory Visit (HOSPITAL_BASED_OUTPATIENT_CLINIC_OR_DEPARTMENT_OTHER): Payer: Self-pay

## 2021-08-27 ENCOUNTER — Other Ambulatory Visit (HOSPITAL_BASED_OUTPATIENT_CLINIC_OR_DEPARTMENT_OTHER): Payer: Self-pay

## 2021-08-30 ENCOUNTER — Other Ambulatory Visit (HOSPITAL_BASED_OUTPATIENT_CLINIC_OR_DEPARTMENT_OTHER): Payer: Self-pay

## 2021-08-30 MED ORDER — METHYLPHENIDATE HCL ER (CD) 60 MG PO CPCR
ORAL_CAPSULE | ORAL | 0 refills | Status: DC
  Filled 2021-08-30: qty 30, 30d supply, fill #0

## 2021-09-13 ENCOUNTER — Other Ambulatory Visit (HOSPITAL_BASED_OUTPATIENT_CLINIC_OR_DEPARTMENT_OTHER): Payer: Self-pay

## 2021-09-13 MED ORDER — METHYLPHENIDATE HCL ER (CD) 30 MG PO CPCR
ORAL_CAPSULE | ORAL | 0 refills | Status: DC
  Filled 2021-09-13: qty 30, 30d supply, fill #0

## 2021-09-24 ENCOUNTER — Other Ambulatory Visit (HOSPITAL_BASED_OUTPATIENT_CLINIC_OR_DEPARTMENT_OTHER): Payer: Self-pay

## 2021-09-24 MED ORDER — METHYLPHENIDATE HCL ER (CD) 60 MG PO CPCR
ORAL_CAPSULE | ORAL | 0 refills | Status: DC
  Filled 2021-09-24: qty 30, 30d supply, fill #0

## 2021-09-29 ENCOUNTER — Other Ambulatory Visit (HOSPITAL_BASED_OUTPATIENT_CLINIC_OR_DEPARTMENT_OTHER): Payer: Self-pay

## 2021-10-10 ENCOUNTER — Encounter (INDEPENDENT_AMBULATORY_CARE_PROVIDER_SITE_OTHER): Payer: Self-pay

## 2021-10-10 DIAGNOSIS — Z0289 Encounter for other administrative examinations: Secondary | ICD-10-CM

## 2021-10-11 ENCOUNTER — Other Ambulatory Visit (HOSPITAL_BASED_OUTPATIENT_CLINIC_OR_DEPARTMENT_OTHER): Payer: Self-pay

## 2021-10-13 ENCOUNTER — Other Ambulatory Visit: Payer: Self-pay

## 2021-10-13 ENCOUNTER — Ambulatory Visit (INDEPENDENT_AMBULATORY_CARE_PROVIDER_SITE_OTHER): Payer: No Typology Code available for payment source | Admitting: Bariatrics

## 2021-10-13 ENCOUNTER — Encounter (INDEPENDENT_AMBULATORY_CARE_PROVIDER_SITE_OTHER): Payer: Self-pay | Admitting: Bariatrics

## 2021-10-13 VITALS — BP 109/72 | HR 83 | Temp 98.2°F | Ht 61.0 in | Wt 167.0 lb

## 2021-10-13 DIAGNOSIS — R0602 Shortness of breath: Secondary | ICD-10-CM

## 2021-10-13 DIAGNOSIS — F5089 Other specified eating disorder: Secondary | ICD-10-CM

## 2021-10-13 DIAGNOSIS — E6609 Other obesity due to excess calories: Secondary | ICD-10-CM

## 2021-10-13 DIAGNOSIS — G4726 Circadian rhythm sleep disorder, shift work type: Secondary | ICD-10-CM

## 2021-10-13 DIAGNOSIS — K5909 Other constipation: Secondary | ICD-10-CM

## 2021-10-13 DIAGNOSIS — Z6831 Body mass index (BMI) 31.0-31.9, adult: Secondary | ICD-10-CM

## 2021-10-13 DIAGNOSIS — R5383 Other fatigue: Secondary | ICD-10-CM | POA: Diagnosis not present

## 2021-10-13 DIAGNOSIS — E559 Vitamin D deficiency, unspecified: Secondary | ICD-10-CM

## 2021-10-13 DIAGNOSIS — Z1331 Encounter for screening for depression: Secondary | ICD-10-CM

## 2021-10-13 DIAGNOSIS — N1831 Chronic kidney disease, stage 3a: Secondary | ICD-10-CM

## 2021-10-13 DIAGNOSIS — R7309 Other abnormal glucose: Secondary | ICD-10-CM

## 2021-10-14 ENCOUNTER — Encounter (INDEPENDENT_AMBULATORY_CARE_PROVIDER_SITE_OTHER): Payer: Self-pay | Admitting: Bariatrics

## 2021-10-14 LAB — COMPREHENSIVE METABOLIC PANEL
ALT: 32 IU/L (ref 0–32)
AST: 31 IU/L (ref 0–40)
Albumin/Globulin Ratio: 2 (ref 1.2–2.2)
Albumin: 4.7 g/dL (ref 3.8–4.8)
Alkaline Phosphatase: 78 IU/L (ref 44–121)
BUN/Creatinine Ratio: 11 (ref 9–23)
BUN: 10 mg/dL (ref 6–24)
Bilirubin Total: <0.2 mg/dL (ref 0.0–1.2)
CO2: 25 mmol/L (ref 20–29)
Calcium: 9 mg/dL (ref 8.7–10.2)
Chloride: 103 mmol/L (ref 96–106)
Creatinine, Ser: 0.94 mg/dL (ref 0.57–1.00)
Globulin, Total: 2.4 g/dL (ref 1.5–4.5)
Glucose: 89 mg/dL (ref 70–99)
Potassium: 4.5 mmol/L (ref 3.5–5.2)
Sodium: 139 mmol/L (ref 134–144)
Total Protein: 7.1 g/dL (ref 6.0–8.5)
eGFR: 77 mL/min/{1.73_m2} (ref >59)

## 2021-10-14 LAB — INSULIN, RANDOM: INSULIN: 9.4 u[IU]/mL (ref 2.6–24.9)

## 2021-10-14 LAB — HEMOGLOBIN A1C
Est. average glucose Bld gHb Est-mCnc: 108 mg/dL
Hgb A1c MFr Bld: 5.4 % (ref 4.8–5.6)

## 2021-10-14 LAB — LIPID PANEL WITH LDL/HDL RATIO
Cholesterol, Total: 203 mg/dL — ABNORMAL HIGH (ref 100–199)
HDL: 83 mg/dL (ref >39)
LDL Chol Calc (NIH): 109 mg/dL — ABNORMAL HIGH (ref 0–99)
LDL/HDL Ratio: 1.3 ratio (ref 0.0–3.2)
Triglycerides: 63 mg/dL (ref 0–149)
VLDL Cholesterol Cal: 11 mg/dL (ref 5–40)

## 2021-10-14 LAB — VITAMIN D 25 HYDROXY (VIT D DEFICIENCY, FRACTURES): Vit D, 25-Hydroxy: 43.2 ng/mL (ref 30.0–100.0)

## 2021-10-14 NOTE — Progress Notes (Signed)
? ? ? ?Chief Complaint:  ? ?OBESITY ?Paula Burke (MR# [id number]) is a 46 y.o. female who presents for evaluation and treatment of obesity and related comorbidities. Current BMI is Body mass index is 31.55 kg/m?Marland Kitchen. Paula Burke has been struggling with her weight for many years and has been unsuccessful in either losing weight, maintaining weight loss, or reaching her healthy weight goal. ? ?Paula Burke states she does not like to cook, and notes obstacles as lack of time and lack of preparation.  ? ?Paula Burke is currently in the action stage of change and ready to dedicate time achieving and maintaining a healthier weight. Paula Burke is interested in becoming our patient and working on intensive lifestyle modifications including (but not limited to) diet and exercise for weight loss. ? ?Paula Burke's habits were reviewed today and are as follows: Her family eats meals together, she thinks her family will eat healthier with her, she struggles with family and or coworkers weight loss sabotage, her desired weight loss is 37 pounds, she has been heavy most of her life, she started gaining weight after 3rd child in 2013, her heaviest weight ever was 168 pounds, she is a picky eater and doesn't like to eat healthier foods, she has significant food cravings issues, she snacks frequently in the evenings, she skips meals frequently, she frequently makes poor food choices, she frequently eats larger portions than normal, she has binge eating behaviors, and she struggles with emotional eating. ? ?Depression Screen ?Paula Burke's Food and Mood (modified PHQ-9) score was 20. ? ? ?  10/13/2021  ?  8:39 AM  ?Depression screen PHQ 2/9  ?Decreased Interest 3  ?Down, Depressed, Hopeless 3  ?PHQ - 2 Score 6  ?Altered sleeping 2  ?Tired, decreased energy 3  ?Change in appetite 3  ?Feeling bad or failure about yourself  3  ?Trouble concentrating 3  ?Moving slowly or fidgety/restless 0  ?Suicidal thoughts 0  ?PHQ-9 Score 20  ?Difficult doing work/chores Extremely  dIfficult  ? ?Subjective:  ? ?1. Other fatigue ?Paula Burke will continue activities. Paula Burke admits to daytime somnolence and admits to waking up still tired. Patient has a history of symptoms of morning fatigue and morning headache. Paula Burke generally gets  5-7  hours of sleep per night, and states that she has difficulty falling back asleep if awakened. Snoring is present. Apneic episodes are not present. Epworth Sleepiness Score is 15.   ? ?2. SOB (shortness of breath) ?Paula Burke will continue activities. Paula Burke notes increasing shortness of breath with exercising and seems to be worsening over time with weight gain. She notes getting out of breath sooner with activity than she used to. This has not gotten worse recently. Paula Burke denies shortness of breath at rest or orthopnea.  ? ?3. Chronic kidney disease, stage 3a (HCC) ?Paula Burke is currently not on medications.  ? ?4. Circadian rhythm sleep disorder, shift work type ?Paula Burke is now working day shift. She worked night shift. ? ?5. Other constipation ?Paula Burke notes intermittent constipation.  ? ?6. Other disorder of eating ?Paula Burke notes emotional stress.  ? ?7. Vitamin D deficiency ?Paula Burke is taking currently multivitamin, calcium, magnesium, zinc and Omega 3.  ? ?8. Elevated glucose ?We discussed elevated glucose today. Her glucose on 10/13/2021 was 108. ? ?Assessment/Plan:  ? ?1. Other fatigue ?Paula Burke will gradually increase activities and exercise. We will review EKG today. Paula Burke does feel that her weight is causing her energy to be lower than it should be. Fatigue may be related to obesity, depression or many other  causes. Labs will be ordered, and in the meanwhile, [name] will focus on self care including making healthy food choices, increasing physical activity and focusing on stress reduction.  ?- EKG 12-Lead ? ?2. SOB (shortness of breath) ?Paula Burke will gradually increase activities and exercise. [name] does feel that she gets out of breath more easily that she used to when she  exercises. [name]'s shortness of breath appears to be obesity related and exercise induced. She has agreed to work on weight loss and gradually increase exercise to treat her exercise induced shortness of breath. Will continue to monitor closely.  ? ?3. Chronic kidney disease, stage 3a (HCC) ?Lab results and trends reviewed. We will check CMP and  lipid panel today. Paula Burke will keep her water intake high. . We discussed several lifestyle modifications today and she will continue to work on diet, exercise and weight loss efforts. Avoid nephrotoxic medications. Orders and follow up as documented in patient record.  ? ?Counseling ?Chronic kidney disease (CKD) happens when the kidneys are damaged over a long period of time. ?Most of the time, this condition does not go away, but it can usually be controlled. Steps must be taken to slow down the kidney damage or to stop it from getting worse. ?Intensive lifestyle modifications are the first line treatment for this issue.  ?Avoid buying foods that are: processed, frozen, or prepackaged to avoid excess salt. ?  ?- Lipid Panel With LDL/HDL Ratio ?- Comprehensive metabolic panel ? ?4. Circadian rhythm sleep disorder, shift work type ?Intensive lifestyle modifications are the first line treatment for this issue. The importance of restful sleep was presented to [facility] today. We discussed several lifestyle modifications today and she will continue to work on diet, exercise and weight loss efforts. We will continue to monitor. Orders and follow up as documented in patient record.   ? ?5. Other constipation ?Paula Burke will increase her water intake. She will continue taking magnesium. She was informed that a decrease in bowel movement frequency is normal while losing weight, but stools should not be hard or painful. Orders and follow up as documented in patient record.  ? ?Counseling ?Getting to Good Bowel Health: Your goal is to have one soft bowel movement each day. Drink at least 8  glasses of water each day. Eat plenty of fiber (goal is over 25 grams each day). It is best to get most of your fiber from dietary sources which includes leafy green vegetables, fresh fruit, and whole grains. You may need to add fiber with the help of OTC fiber supplements. These include Metamucil, Citrucel, and Flaxseed. If you are still having trouble, try adding Miralax or Magnesium Citrate. If all of these changes do not work, Dietitian.  ? ?6. Other disorder of eating ?Paula Burke was provided a referral to Dr. Dewaine Conger our bariatric psychologist for emotional stress.  Behavior modification techniques were discussed today to help Paula Burke deal with her emotional/non-hunger eating behaviors.  Orders and follow up as documented in patient record.  ? ?7. Vitamin D deficiency ?Low Vitamin D level contributes to fatigue and are associated with obesity, breast, and colon cancer. Paula Burke will continue Vitamin D. We will check Vitamin D today and she will follow-up for routine testing of Vitamin D, at least 2-3 times per year to avoid over-replacement. ? ?- VITAMIN D 25 Hydroxy (Vit-D Deficiency, Fractures) ? ?8. Elevated glucose ?We will check A1C and insulin today.  ? ?- Insulin, random ?- Hemoglobin A1c ? ?9. Depression screen ?Paula Mile  had a positive depression screening. Depression is commonly associated with obesity and often results in emotional eating behaviors. We will monitor this closely and work on CBT to help improve the non-hunger eating patterns. Referral to Psychology may be required if no improvement is seen as she continues in our clinic.  ? ?10. Class 1 obesity due to excess calories without serious comorbidity with body mass index (BMI) of 31.0 to 31.9 in adult ?Giavonni is currently in the action stage of change and her goal is to continue with weight loss efforts. I recommend Maiah begin the structured treatment plan as follows: ? ?She has agreed to the Category 3 Plan and keeping a food journal and  adhering to recommended goals of 1500 calories and 90 grams of protein. ? ?Brittne will continue meal planning and she will continue intentional eating. She will do no meal skipping.  ? ?Exercise goals: No exercise has

## 2021-10-16 ENCOUNTER — Other Ambulatory Visit (HOSPITAL_BASED_OUTPATIENT_CLINIC_OR_DEPARTMENT_OTHER): Payer: Self-pay

## 2021-10-18 ENCOUNTER — Other Ambulatory Visit (HOSPITAL_BASED_OUTPATIENT_CLINIC_OR_DEPARTMENT_OTHER): Payer: Self-pay

## 2021-10-18 MED ORDER — ESCITALOPRAM OXALATE 20 MG PO TABS
ORAL_TABLET | ORAL | 1 refills | Status: DC
  Filled 2021-10-18: qty 90, 90d supply, fill #0

## 2021-10-19 NOTE — Progress Notes (Signed)
?Office: [phone]  /  Fax: [phone] ? ? ? ?Date: 11/02/2021   ?Appointment Start Time: 11:02am ?Duration: 66 minutes ?Provider: Lawerance CruelGaytri Danzell Birky, Psy.D. ?Type of Session: Intake for Individual Therapy  ?Location of Patient: Home (private location) ?Location of Provider: Provider's home (private office) ?Type of Contact: Telepsychological Visit via MyChart Video Visit ? ?Informed Consent: Prior to proceeding with today's appointment, two pieces of identifying information were obtained. In addition, [name]'s physical location at the time of this appointment was obtained as well a phone number she could be reached at in the event of technical difficulties. Paula Burke and this provider participated in today's telepsychological service.  ? ?The provider's role was explained to Paula Burke. The provider reviewed and discussed issues of confidentiality, privacy, and limits therein (e.g., reporting obligations). In addition to verbal informed consent, written informed consent for psychological services was obtained prior to the initial appointment. Since the clinic is not a 24/7 crisis center, mental health emergency resources were shared and this  provider explained MyChart, e-mail, voicemail, and/or other messaging systems should be utilized only for non-emergency reasons. This provider also explained that information obtained during appointments will be [facility] and relevant information will be shared with other providers at [facility] for coordination of care. Paula Burke agreed information may be shared with other [facility] providers as needed for coordination of care and by signing the service agreement document, she provided written consent for coordination of care. Prior to initiating telepsychological services, Paula Burke completed an informed consent document, which included the development of a safety plan (i.e., an emergency contact and emergency resources) in the  event of an emergency/crisis. Paula Burke verbally acknowledged understanding she is ultimately responsible for understanding her insurance benefits for telepsychological and in-person services. This provider also reviewed confidentiality, as it relates to telepsychological services, as well as the rationale for telepsychological services (i.e., to reduce exposure risk to COVID-19). Paula Burke  acknowledged understanding that appointments cannot be recorded without both party consent and she is aware she is responsible for securing confidentiality on her end of the session. Paula Burke verbally consented to proceed. ? ?Chief Complaint/HPI: Paula Burke was referred by Dr. [name] due to other disorder of eating. Per the note for the initial visit with Dr. [name] on 10/13/2021, "Paula Burke notes emotional stress." The note for the initial appointment further indicated the following: "Paula Burke's habits were reviewed today and are as follows: Her family eats meals together, she thinks her family will eat healthier with her, she struggles with family and or coworkers weight loss sabotage, her desired weight loss is 37 pounds, she has been heavy most of her life, she started gaining weight after 3rd child in 2013, her heaviest weight ever was 168 pounds, she is a picky eater and doesn't like to eat healthier foods, she has significant food cravings issues, she snacks frequently in the evenings, she skips meals frequently, she frequently makes poor food choices, she frequently eats larger portions than normal, she has binge eating behaviors, and she struggles with emotional eating." Paula Burke's Food and Mood (modified PHQ-9) score on 10/13/2021 was 20. ? ?During today's appointment, Paula Burke shared she engages in emotional eating behaviors secondary to stress. She was verbally administered a questionnaire assessing various behaviors related to emotional eating behaviors. Paula Burke endorsed the following: overeat when you are celebrating, experience food  cravings on a regular basis, eat certain foods when you are anxious, stressed, depressed, or your feelings are hurt, use food to  help you cope with emotional situations, find food is comforting to you, overeat when you are angry or upset, overeat when you are worried about something, overeat frequently when you are bored or lonely, overeat when you are angry at someone just to show them they cannot control you, overeat when you are alone, but eat much less when you are with other people, eat to help you stay awake, and eat as a reward. Paula Burke believes the onset of emotional/binge eating behaviors was likely in the past 15 years and described the current frequency of emotional eating behaviors as "few times a week." In addition, Paula Burke endorsed a history of binge eating behaviors. She described it as occurring at night when she has time for herself, noting she typically eats ice cream (approximately one pint) while watching television. She described the frequency of engagement in binge eating behaviors as "several time a week," but it has decreased to approximately 1x a week since starting with the clinic. Notably, she stated she began engaging in binge eating behaviors around 2013, noting fluctuations in the frequency but it has been ongoing for 3+ months. When experiencing binge eating episodes, she reported feeling out of control; eating until feeling uncomfortably full; eating large amounts of food when not feeling physically hungry; eating alone because of being embarrassed by how much one is eating; and feeling disgusted with herself and guilty. Paula Burke shared she is trying to not have "any typical binge foods" in the house, noting it is mostly ice cream and candy. She denied currently engaging in any compensatory strategies. Moreover, Paula Burke acknowledged instances of binge eating and purging around 2015 for weight loss, adding the last time she purged was "last year sometime." She is unsure if she has ever been  diagnosed with an eating disorder; however, she stated a doctor referred her to a therapist that specializes in eating disorders around 2017. Furthermore, Paula Mile discussed experiencing challenges with "time management" and "emotional regulation."  ? ?Mental Status Examination:  ?Appearance: neat ?Behavior: appropriate to circumstances ?Mood: depressed ?Affect: mood congruent; tearful  ?Speech: WNL ?Eye Contact: appropriate ?Psychomotor Activity: WNL ?Gait: unable to assess  ?Thought Process: linear, logical, and goal directed and denies suicidal, self-harm, and homicidal ideation, plan and intent ?Thought Content/Perception: no hallucinations, delusions, bizarre thinking or behavior endorsed or observed ?Orientation: AAOx4 ?Memory/Concentration: memory, attention, language, and fund of knowledge intact  ?Insight/Judgment: fair ? ?Family & Psychosocial History: Paula Burke reported she is married, and she has three children (ages 14, 67, and 33). 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 Additionally, Paula Mile shared her highest level of education obtained is a BSN degree. Currently, Paula Burke's social support system consists of her husband, church family, some friends, and mother. Moreover, Paula Burke stated she resides with her husband and children.  ? ?Medical History:  ?Past Medical History:  ?Diagnosis Date  ? Acne   ? ADHD   ? Anxiety   ? Back pain   ? Carpal tunnel syndrome   ? Constipation   ? Depression   ? Edema of both lower extremities   ? H/O wisdom tooth extraction 09/15/2001  ? Headache(784.0)   ? Herpes simplex   ? Joint pain   ? Meningitis   ? Mental disorder   ? Thrombocytopenia complicating pregnancy (HCC)   ? Yeast infection   ? ?Past Surgical History:  ?Procedure Laterality Date  ? BREAST SURGERY    ? chicken pox    ? OSTEOTOMY  11/2020  ?  left great toe  ? scoliosis    ? ?Current Outpatient Medications on File Prior to Visit  ?Medication Sig Dispense Refill  ?  amphetamine-dextroamphetamine (ADDERALL) 30 MG tablet Take 0.5 tablets by mouth 2 (two) times daily. 30 tablet 0  ? amphetamine-dextroamphetamine (ADDERALL) 30 MG tablet Take 0.5 tablet by mouth 2 (two) times daily. 3

## 2021-10-20 ENCOUNTER — Other Ambulatory Visit (HOSPITAL_BASED_OUTPATIENT_CLINIC_OR_DEPARTMENT_OTHER): Payer: Self-pay

## 2021-10-20 ENCOUNTER — Encounter: Payer: Self-pay | Admitting: Physician Assistant

## 2021-10-20 ENCOUNTER — Ambulatory Visit (INDEPENDENT_AMBULATORY_CARE_PROVIDER_SITE_OTHER): Payer: No Typology Code available for payment source | Admitting: Physician Assistant

## 2021-10-20 VITALS — BP 116/65 | HR 79 | Ht 61.0 in | Wt 163.6 lb

## 2021-10-20 DIAGNOSIS — R5383 Other fatigue: Secondary | ICD-10-CM

## 2021-10-20 DIAGNOSIS — F331 Major depressive disorder, recurrent, moderate: Secondary | ICD-10-CM

## 2021-10-20 DIAGNOSIS — F411 Generalized anxiety disorder: Secondary | ICD-10-CM | POA: Diagnosis not present

## 2021-10-20 DIAGNOSIS — F902 Attention-deficit hyperactivity disorder, combined type: Secondary | ICD-10-CM

## 2021-10-20 DIAGNOSIS — R5381 Other malaise: Secondary | ICD-10-CM

## 2021-10-20 DIAGNOSIS — F422 Mixed obsessional thoughts and acts: Secondary | ICD-10-CM

## 2021-10-20 MED ORDER — LORAZEPAM 0.5 MG PO TABS
0.2500 mg | ORAL_TABLET | Freq: Three times a day (TID) | ORAL | 1 refills | Status: AC | PRN
  Filled 2021-10-20: qty 30, 10d supply, fill #0

## 2021-10-20 MED ORDER — AMPHETAMINE-DEXTROAMPHETAMINE 30 MG PO TABS: 15.0000 mg | ORAL_TABLET | Freq: Two times a day (BID) | ORAL | 0 refills | Status: DC

## 2021-10-20 MED ORDER — ESCITALOPRAM OXALATE 20 MG PO TABS
30.0000 mg | ORAL_TABLET | Freq: Every day | ORAL | 1 refills | Status: DC
  Filled 2021-10-20 (×2): qty 45, 30d supply, fill #0
  Filled 2021-11-10 – 2021-11-17 (×2): qty 45, 30d supply, fill #1
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
## 2021-10-21 ENCOUNTER — Other Ambulatory Visit (HOSPITAL_BASED_OUTPATIENT_CLINIC_OR_DEPARTMENT_OTHER): Payer: Self-pay

## 2021-10-21 MED ORDER — AMPHETAMINE-DEXTROAMPHETAMINE 30 MG PO TABS
ORAL_TABLET | ORAL | 0 refills | Status: DC
Start: 2021-10-20 — End: 2022-01-05
  Filled 2021-10-21: qty 30, 30d supply, fill #0

## 2021-11-02 ENCOUNTER — Telehealth (INDEPENDENT_AMBULATORY_CARE_PROVIDER_SITE_OTHER): Payer: No Typology Code available for payment source | Admitting: Psychology

## 2021-11-02 DIAGNOSIS — F331 Major depressive disorder, recurrent, moderate: Secondary | ICD-10-CM

## 2021-11-02 DIAGNOSIS — F5081 Binge eating disorder: Secondary | ICD-10-CM

## 2021-11-02 DIAGNOSIS — F909 Attention-deficit hyperactivity disorder, unspecified type: Secondary | ICD-10-CM

## 2021-11-02 NOTE — Progress Notes (Unsigned)
?  Office: [phone]  /  Fax: [phone] ? ? ? ?Date: 11/16/2021   ?Appointment Start Time: *** ?Duration: *** minutes ?Provider: Lawerance Cruel, Psy.D. ?Type of Session: Individual Therapy  ?Location of Patient: {gbptloc:23249} (private location) ?Location of Provider: Provider's Home (private office) ?Type of Contact: Telepsychological Visit via MyChart Video Visit ? ?Session Content: Paula Burke is a 46 y.o. female presenting for a follow-up appointment to address the previously established treatment goal of increasing coping skills.Today's appointment was a telepsychological visit due to COVID-19. Paula Burke provided verbal consent for today's telepsychological appointment and she is aware she is responsible for securing confidentiality on her end of the session. Prior to proceeding with today's appointment, Paula Burke's physical location at the time of this appointment was obtained as well a phone number she could be reached at in the event of technical difficulties. Paula Burke and this provider participated in today's telepsychological service.  ? ?This provider conducted a brief check-in. *** Paula Burke was receptive to today's appointment as evidenced by openness to sharing, responsiveness to feedback, and {gbreceptiveness:23401}. ? ?Mental Status Examination:  ?Appearance: {Appearance:22431} ?Behavior: {Behavior:22445} ?Mood: {gbmood:21757} ?Affect: {Affect:22436} ?Speech: {Speech:22432} ?Eye Contact: {Eye Contact:22433} ?Psychomotor Activity: {Motor Activity:22434} ?Gait: {gbgait:23404} ?Thought Process: {thought process:22448}  ?Thought Content/Perception: {disturbances:22451} ?Orientation: {Orientation:22437} ?Memory/Concentration: {gbcognition:22449} ?Insight: {Insight:22446} ?Judgment: {Insight:22446} ? ?Interventions:  ?{Interventions for Progress Notes:23405} ? 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 ?Treatment Goal & Progress: During the initial appointment with this provider, the following treatment goal was established: increase coping skills. Paula Burke has demonstrated progress in her goal as evidenced by {gbtxprogress:22839}. Paula Burke also {gbtxprogress2:22951}. ? ?Plan: The next appointment is scheduled for *** at ***, which will be via MyChart Video Visit. The next session will focus on {Plan for Next Appointment:23400}. ? ?

## 2021-11-03 ENCOUNTER — Encounter (INDEPENDENT_AMBULATORY_CARE_PROVIDER_SITE_OTHER): Payer: Self-pay | Admitting: Bariatrics

## 2021-11-03 ENCOUNTER — Ambulatory Visit (INDEPENDENT_AMBULATORY_CARE_PROVIDER_SITE_OTHER): Payer: No Typology Code available for payment source | Admitting: Bariatrics

## 2021-11-03 VITALS — BP 99/68 | HR 91 | Temp 98.1°F | Ht 61.0 in | Wt 162.0 lb

## 2021-11-03 DIAGNOSIS — E6609 Other obesity due to excess calories: Secondary | ICD-10-CM

## 2021-11-03 DIAGNOSIS — E7800 Pure hypercholesterolemia, unspecified: Secondary | ICD-10-CM

## 2021-11-03 DIAGNOSIS — F3289 Other specified depressive episodes: Secondary | ICD-10-CM

## 2021-11-03 DIAGNOSIS — Z6830 Body mass index (BMI) 30.0-30.9, adult: Secondary | ICD-10-CM

## 2021-11-03 DIAGNOSIS — E669 Obesity, unspecified: Secondary | ICD-10-CM | POA: Diagnosis not present

## 2021-11-08 ENCOUNTER — Ambulatory Visit (INDEPENDENT_AMBULATORY_CARE_PROVIDER_SITE_OTHER): Payer: No Typology Code available for payment source | Admitting: Bariatrics

## 2021-11-11 ENCOUNTER — Other Ambulatory Visit (HOSPITAL_BASED_OUTPATIENT_CLINIC_OR_DEPARTMENT_OTHER): Payer: Self-pay

## 2021-11-11 ENCOUNTER — Encounter (HOSPITAL_BASED_OUTPATIENT_CLINIC_OR_DEPARTMENT_OTHER): Payer: Self-pay | Admitting: Pharmacist

## 2021-11-16 ENCOUNTER — Telehealth (INDEPENDENT_AMBULATORY_CARE_PROVIDER_SITE_OTHER): Payer: No Typology Code available for payment source | Admitting: Psychology

## 2021-11-16 ENCOUNTER — Encounter (INDEPENDENT_AMBULATORY_CARE_PROVIDER_SITE_OTHER): Payer: Self-pay | Admitting: Bariatrics

## 2021-11-16 NOTE — Progress Notes (Signed)
? ? ? ?Chief Complaint:  ? ?OBESITY ?Paula Burke is here to discuss her progress with her obesity treatment plan along with follow-up of her obesity related diagnoses. Paula Burke is on the Category 3 Plan and states she is following her eating plan approximately 50% of the time. Paula Burke states she is not currently exercising. ? ?Today's visit was #: 2 ?Starting weight: 167 lb ?Starting date: 10/07/2021 ?Today's weight: 162 lb ?Today's date: 11/03/2021 ?Total lbs lost to date: 5 lbs ?Total lbs lost since last in-office visit: 5 lbs ? ?Interim History: Paula Burke is down 5 lbs since her first visit. She did well the first week, only dinner was a struggle. ? ?Subjective:  ? ?1. Elevated cholesterol ?Paula Burke has mild hyperlipidemia and has been trying to improve her cholesterol levels with intensive lifestyle modifications including a low saturated fat diet, exercise, and weight loss. She denies any chest pain, claudication, or myalgias. She is currently taking fish oil. ? ?2. Other depression, with emotional eating ?Paula Burke reports to be stable. She is currently taking Lexapro and Wellbutrin. ? ?Assessment/Plan:  ? ?1. Elevated cholesterol ?Paula Burke will work on diet and exercise. Paula Burke will have no trans fats and read labels. Cardiovascular risk and specific lipid/LDL goals reviewed.  We discussed several lifestyle modifications today and Paula Burke will continue to work on diet, exercise and weight loss efforts. Orders and follow up as documented in patient record.  ? ?Counseling ?Intensive lifestyle modifications are the first line treatment for this issue. ?Dietary changes: Increase soluble fiber. Decrease simple carbohydrates. ?Exercise changes: Moderate to vigorous-intensity aerobic activity 150 minutes per week if tolerated. ?Lipid-lowering medications: see documented in medical record. ? ?2. Other depression, with emotional eating ?Paula Burke will continue her Lexapro. ? ?3. Obesity, current BMI 30.7 ?Paula Burke is currently in the action stage of  change. As such, her goal is to continue with weight loss efforts. She has agreed to the Category 3 Plan, journal 1,500 calories and 90-100 grams of protein. ? ?Paula Burke agreed with meal planning and intentional eating, All labs as on 10/20/2021 were reviewed (CMP, Lipid panel, A1c & insulin). Paula Burke will use "My Fitness Pal". ? ?Exercise goals: Paula Burke will increase her activity. ? ?Behavioral modification strategies: increasing lean protein intake, decreasing simple carbohydrates, increasing vegetables, increasing water intake, decreasing eating out, no skipping meals, meal planning and cooking strategies, keeping healthy foods in the home, and planning for success. ? ?Paula Burke has agreed to follow-up with our clinic in 2-3  weeks. She was informed of the importance of frequent follow-up visits to maximize her success with intensive lifestyle modifications for her multiple health conditions.  ? ?Objective:  ? ?Blood pressure 99/68, pulse 91, temperature 98.1 ?F (36.7 ?C), height 5\' 1"  (1.549 m), weight 162 lb (73.5 kg), SpO2 100 %, currently breastfeeding. ?Body mass index is 30.61 kg/m?. ? ?General: Cooperative, alert, well developed, in no acute distress. ?HEENT: Conjunctivae and lids unremarkable. ?Cardiovascular: Regular rhythm.  ?Lungs: Normal work of breathing. ?Neurologic: No focal deficits.  ? ?Lab Results  ?Component Value Date  ? CREATININE 0.94 10/13/2021  ? BUN 10 10/13/2021  ? NA 139 10/13/2021  ? K 4.5 10/13/2021  ? CL 103 10/13/2021  ? CO2 25 10/13/2021  ? ?Lab Results  ?Component Value Date  ? ALT 32 10/13/2021  ? AST 31 10/13/2021  ? ALKPHOS 78 10/13/2021  ? BILITOT <0.2 10/13/2021  ? ?Lab Results  ?Component Value Date  ? HGBA1C 5.4 10/13/2021  ? ?Lab Results  ?Component Value Date  ? INSULIN  9.4 10/13/2021  ? ?No results found for: TSH ?Lab Results  ?Component Value Date  ? CHOL 203 (H) 10/13/2021  ? HDL 83 10/13/2021  ? LDLCALC 109 (H) 10/13/2021  ? TRIG 63 10/13/2021  ? ?Lab Results  ?Component Value  Date  ? VD25OH 43.2 10/13/2021  ? ?Lab Results  ?Component Value Date  ? WBC 7.8 12/06/2011  ? HGB 15.5 (H) 12/06/2011  ? HCT 45.7 12/06/2011  ? MCV 85.7 12/06/2011  ? PLT 131 (L) 12/06/2011  ? ?No results found for: IRON, TIBC, FERRITIN ? ?Attestation Statements:  ? ?Reviewed by clinician on day of visit: allergies, medications, problem list, medical history, surgical history, family history, social history, and previous encounter notes. ?  ?I, Paulla Fore, CMA, am acting as transcriptionist for Dr. Corinna Capra, DO. ? ?I have reviewed the above documentation for accuracy and completeness, and I agree with the above. Corinna Capra, DO ? ?

## 2021-11-17 ENCOUNTER — Other Ambulatory Visit (HOSPITAL_BASED_OUTPATIENT_CLINIC_OR_DEPARTMENT_OTHER): Payer: Self-pay

## 2021-11-17 ENCOUNTER — Other Ambulatory Visit: Payer: Self-pay

## 2021-11-18 ENCOUNTER — Other Ambulatory Visit (HOSPITAL_BASED_OUTPATIENT_CLINIC_OR_DEPARTMENT_OTHER): Payer: Self-pay

## 2021-11-26 ENCOUNTER — Encounter (INDEPENDENT_AMBULATORY_CARE_PROVIDER_SITE_OTHER): Payer: Self-pay | Admitting: Bariatrics

## 2021-12-03 ENCOUNTER — Encounter: Payer: Self-pay | Admitting: Physician Assistant

## 2021-12-03 ENCOUNTER — Other Ambulatory Visit (HOSPITAL_BASED_OUTPATIENT_CLINIC_OR_DEPARTMENT_OTHER): Payer: Self-pay

## 2021-12-03 ENCOUNTER — Ambulatory Visit (INDEPENDENT_AMBULATORY_CARE_PROVIDER_SITE_OTHER): Payer: No Typology Code available for payment source | Admitting: Physician Assistant

## 2021-12-03 DIAGNOSIS — F331 Major depressive disorder, recurrent, moderate: Secondary | ICD-10-CM | POA: Diagnosis not present

## 2021-12-03 DIAGNOSIS — F422 Mixed obsessional thoughts and acts: Secondary | ICD-10-CM

## 2021-12-03 DIAGNOSIS — F411 Generalized anxiety disorder: Secondary | ICD-10-CM | POA: Diagnosis not present

## 2021-12-03 DIAGNOSIS — F902 Attention-deficit hyperactivity disorder, combined type: Secondary | ICD-10-CM

## 2021-12-03 MED ORDER — AMPHETAMINE-DEXTROAMPHETAMINE 30 MG PO TABS
15.0000 mg | ORAL_TABLET | Freq: Two times a day (BID) | ORAL | 0 refills | Status: DC
  Filled 2021-12-03: qty 30, 30d supply, fill #0

## 2021-12-03 MED ORDER — BUPROPION HCL ER (XL) 150 MG PO TB24
ORAL_TABLET | ORAL | 2 refills | Status: DC
Start: 2021-12-03 — End: 2022-04-22
  Filled 2021-12-03: qty 90, fill #0
  Filled 2022-01-18: qty 90, 90d supply, fill #0

## 2021-12-03 MED ORDER — ESCITALOPRAM OXALATE 20 MG PO TABS
40.0000 mg | ORAL_TABLET | Freq: Every day | ORAL | 1 refills | Status: DC
Start: 2021-12-03 — End: 2022-01-05
  Filled 2021-12-03 – 2021-12-13 (×2): qty 60, 30d supply, fill #0

## 2021-12-03 NOTE — Progress Notes (Signed)
[facility] Med Check  Patient ID: Paula Burke,  MRN: [id number]  PCP: [name], PA  Date of Evaluation: 12/03/2021 Time spent:30 minutes  Chief Complaint:  Chief Complaint   Anxiety; Depression; ADD; Follow-up     HISTORY/CURRENT STATUS: HPI For routine med check.  At [place], we increased Lexapro, it's helped but still doesn't like to do things she used to enjoy. Doesn't have a lot of energy or motivation. Not missing work. Doesn't cry easily. ADLS and personal hygiene are nl.  Appetite and weight are stable.  No suicidal or homicidal thoughts.  Still has fixations on things. She'll get online and search things for hours, 'can't let things go' having trouble moving on.  Doesn't keep her awake at night, but does go to bed late. "Time management problem." No trouble falling asleep or staying asleep. Denies PA. Ativan has helped the few times that she has taken it. Picking at her nails hasn't been as bad. Started NAC, not sure if that has helped or the fact that she had her nails done. Either way, it's better.   Also changed Ritalin LA to Adderall IR. That has been more helpful than the long acting. Able to focus and get things done in a timely manner. Also gives her more energy and doesn't feel a crash in the afternoons.  Patient denies increased energy with decreased need for sleep, no increased talkativeness, no racing thoughts, no impulsivity or risky behaviors, no increased spending, no increased libido, no grandiosity, no increased irritability or anger, no paranoia, and no hallucinations.  Denies dizziness, syncope, seizures, numbness, tingling, tremor, tics, unsteady gait, slurred speech, confusion. Denies muscle or joint pain, stiffness, or dystonia.   Individual Medical History/ Review of Systems: Changes? :No   Past medications for mental health diagnoses include: Adderall XR and IR, Wellbutrin XL (at one point she was on it alone but d/t anxiety dose was decreased and  Lexapro added a few years ago) Lexapro, Zoloft caused sexual side effects, Effexor, Trintellix (didn't take long) Metadate, Vyvanse, Melatonin, Ativan, Xanax, Trazodone    Allergies: Patient has no known allergies.  Current Medications:  Current Outpatient Medications:    Acetylcysteine 600 MG CAPS, Take 600 mg by mouth in the morning., Disp: , Rfl:    amphetamine-dextroamphetamine (ADDERALL) 30 MG tablet, Take 0.5 tablet by mouth 2 (two) times daily., Disp: 30 tablet, Rfl: 0   LORazepam (ATIVAN) 0.5 MG tablet, Take 0.5-1 tablets (0.25-0.5 mg total) by mouth every 8 (eight) hours as needed for anxiety., Disp: 30 tablet, Rfl: 1   magnesium gluconate (MAGONATE) 500 MG tablet, Take 240 mg by mouth 2 (two) times daily. 240 mg twice nightly, Disp: , Rfl:    Multiple Vitamin (MULTI-VITAMIN) tablet, Take 1 tablet by mouth daily., Disp: , Rfl:    Multiple Vitamins-Minerals (AIRBORNE) TBEF, See admin instructions., Disp: , Rfl:    Omega-3 Fatty Acids (FISH OIL) 1000 MG CAPS, Take by mouth., Disp: , Rfl:    spironolactone (ALDACTONE) 50 MG tablet, 3 tablets Orally Once a day 90 days, Disp: 270 tablet, Rfl: 3   tretinoin (RETIN-A) 0.1 % cream, 1 application in the evening to face Externally Once at night 30 days, Disp: 45 g, Rfl: 3   valACYclovir (VALTREX) 1000 MG tablet, 1 tablet Orally Once a day 90 days, Disp: 90 tablet, Rfl: 3   amphetamine-dextroamphetamine (ADDERALL) 30 MG tablet, Take 0.5 tablets by mouth 2 (two) times daily., Disp: 30 tablet, Rfl: 0   buPROPion (WELLBUTRIN XL)  150 MG 24 hr tablet, 1 tablet in the morning Orally Once a day 90 days, Disp: 90 tablet, Rfl: 2   COVID-19 At Home Antigen Test (CARESTART COVID-19 HOME TEST) KIT, Use as directed within package instructions (Patient not taking: Reported on 12/03/2021), Disp: 4 kit, Rfl: 0   escitalopram (LEXAPRO) 20 MG tablet, Take 2 tablets (40 mg total) by mouth daily., Disp: 60 tablet, Rfl: 1   Melatonin 2.5 MG CHEW, Chew 2.5 mg by mouth.  (Patient not taking: Reported on 12/03/2021), Disp: , Rfl:  Medication Side Effects: none  Family Medical/ Social History: Changes? No  MENTAL HEALTH EXAM:  currently breastfeeding.There is no height or weight on file to calculate BMI.  General Appearance: Casual and Well Groomed  Eye Contact:  Good  Speech:  Clear and Coherent and Normal Rate  Volume:  Normal  Mood:  Euthymic  Affect:  Congruent  Thought Process:  Goal Directed and Descriptions of Associations: Circumstantial  Orientation:  Full (Time, Place, and Person)  Thought Content: Obsessions and Rumination   Suicidal Thoughts:  No  Homicidal Thoughts:  No  Memory:  WNL  Judgement:  Good  Insight:  Good  Psychomotor Activity:  Normal  Concentration:  Concentration: Good  Recall:  Good  Fund of Knowledge: Good  Language: Good  Assets:  Desire for Improvement  ADL's:  Intact  Cognition: WNL  Prognosis:  Good    DIAGNOSES:    ICD-10-CM   1. Major depressive disorder, recurrent episode, moderate (HCC)  F33.1     2. Attention deficit hyperactivity disorder (ADHD), combined type  F90.2     3. Generalized anxiety disorder  F41.1     4. Mixed obsessional thoughts and acts  F42.2       Receiving Psychotherapy: Yes    with Evelena Peat Deussing   RECOMMENDATIONS:  PDMP reviewed.  Adderall filled 10/21/2021.  Ativan filled 10/20/2021. I provided 20 minutes of face to face time during this encounter, including time spent before and after the visit in records review, medical decision making, counseling pertinent to today's visit, and charting.  We discussed different options for treatment.  Increasing the Wellbutrin would probably help with energy and motivation but I feel that the Lexapro should be increased due to the anxiety and OCD tendencies.  She understands that this is higher than the FDA recommended dose but well established as noted in Dr. Antony Contras prescribers guide that sometimes 30 mg to 40 mg is necessary in some  patients.  She would like to try it.  Increasing the Wellbutrin is still an option, she took up to 300 mg in the past.  She was having more anxiety at the time but took Wellbutrin for a long time without having increased anxiety.  Not sure if it was related or just coincidental. Otherwise no changes will be made.  Continue NAC 600 mg, 1 p.o. daily or twice daily. Continue Adderall 30 mg, one half p.o. twice a day. Continue Wellbutrin XL 150 mg daily. Increase Lexapro to a total of 40 mg daily. Continue Ativan 0.5 mg, 1/2-1 3 times daily as needed severe anxiety.  Take sparingly. Continue multivitamin, fish oil, B complex Continue therapy with Evelena Peat Deussing. Return in 4 weeks.  Donnal Moat, PA-C

## 2021-12-09 ENCOUNTER — Ambulatory Visit (INDEPENDENT_AMBULATORY_CARE_PROVIDER_SITE_OTHER): Payer: No Typology Code available for payment source | Admitting: Bariatrics

## 2021-12-14 ENCOUNTER — Other Ambulatory Visit (HOSPITAL_BASED_OUTPATIENT_CLINIC_OR_DEPARTMENT_OTHER): Payer: Self-pay

## 2021-12-15 ENCOUNTER — Encounter (INDEPENDENT_AMBULATORY_CARE_PROVIDER_SITE_OTHER): Payer: Self-pay | Admitting: Bariatrics

## 2021-12-15 ENCOUNTER — Ambulatory Visit (INDEPENDENT_AMBULATORY_CARE_PROVIDER_SITE_OTHER): Payer: No Typology Code available for payment source | Admitting: Bariatrics

## 2021-12-15 VITALS — BP 99/67 | HR 84 | Temp 97.7°F | Ht 61.0 in | Wt 163.0 lb

## 2021-12-15 DIAGNOSIS — Z6830 Body mass index (BMI) 30.0-30.9, adult: Secondary | ICD-10-CM | POA: Diagnosis not present

## 2021-12-15 DIAGNOSIS — F5089 Other specified eating disorder: Secondary | ICD-10-CM | POA: Diagnosis not present

## 2021-12-15 DIAGNOSIS — E669 Obesity, unspecified: Secondary | ICD-10-CM | POA: Diagnosis not present

## 2021-12-15 DIAGNOSIS — Z6831 Body mass index (BMI) 31.0-31.9, adult: Secondary | ICD-10-CM

## 2021-12-17 NOTE — Progress Notes (Signed)
Chief Complaint:   OBESITY Paula Burke is here to discuss her progress with her obesity treatment plan along with follow-up of her obesity related diagnoses. Paula Burke is on the Category 3 Plan and keeping a food journal and adhering to recommended goals of 1500 calories and 90-100 grams of protein and states she is following her eating plan approximately 25% of the time. Paula Burke states she is walking, using the elliptical, and strength training for 30 minutes 3 times per week.  Today's visit was #: 3 Starting weight: 167 lbs Starting date: 10/07/2021 Today's weight: 163 lbs Today's date: 12/15/2021 Total lbs lost to date: 4 lbs Total lbs lost since last in-office visit: 0  Interim History: Paula Burke is up 1 lb since her last visit. Per the bioimpedance scale her water weight is up 1 lb since her last visit. She states that she lost motivation and not tracking her food.   Subjective:   1. Other disorder of eating Paula Burke has a therapist.   Assessment/Plan:   1. Other disorder of eating Behavior modification techniques were discussed today to help Paula Burke deal with her emotional/non-hunger eating behaviors.  Paula Burke will talk with her therapist about emotional eating. She will get in some group exercise activities. She will pick a plan. Orders and follow up as documented in patient record.   2. Obesity, current BMI 30.8 Paula Burke is currently in the action stage of change. As such, her goal is to continue with weight loss efforts. She has agreed to keeping a food journal and adhering to recommended goals of 1500 calories and 90-100 grams of protein.   Paula Burke will adhere closely to the plan 80-90%. She will resume training on "My Fitness App".  Exercise goals:  As is.   Behavioral modification strategies: increasing lean protein intake, decreasing simple carbohydrates, increasing vegetables, increasing water intake, decreasing eating out, no skipping meals, meal planning and cooking strategies, keeping  healthy foods in the home, and planning for success.  Paula Burke has agreed to follow-up with our clinic in 2-3 weeks. She was informed of the importance of frequent follow-up visits to maximize her success with intensive lifestyle modifications for her multiple health conditions.   Objective:   Blood pressure 99/67, pulse 84, temperature 97.7 F (36.5 C), height 5\' 1"  (1.549 m), weight 163 lb (73.9 kg), SpO2 99 %, currently breastfeeding. Body mass index is 30.8 kg/m.  General: Cooperative, alert, well developed, in no acute distress. HEENT: Conjunctivae and lids unremarkable. Cardiovascular: Regular rhythm.  Lungs: Normal work of breathing. Neurologic: No focal deficits.   Lab Results  Component Value Date   CREATININE 0.94 10/13/2021   BUN 10 10/13/2021   NA 139 10/13/2021   K 4.5 10/13/2021   CL 103 10/13/2021   CO2 25 10/13/2021   Lab Results  Component Value Date   ALT 32 10/13/2021   AST 31 10/13/2021   ALKPHOS 78 10/13/2021   BILITOT <0.2 10/13/2021   Lab Results  Component Value Date   HGBA1C 5.4 10/13/2021   Lab Results  Component Value Date   INSULIN 9.4 10/13/2021   No results found for: TSH Lab Results  Component Value Date   CHOL 203 (H) 10/13/2021   HDL 83 10/13/2021   LDLCALC 109 (H) 10/13/2021   TRIG 63 10/13/2021   Lab Results  Component Value Date   VD25OH 43.2 10/13/2021   Lab Results  Component Value Date   WBC 7.8 12/06/2011   HGB 15.5 (H) 12/06/2011   HCT  45.7 12/06/2011   MCV 85.7 12/06/2011   PLT 131 (L) 12/06/2011   No results found for: IRON, TIBC, FERRITIN  Attestation Statements:   Reviewed by clinician on day of visit: allergies, medications, problem list, medical history, surgical history, family history, social history, and previous encounter notes.  I, Jackson Latino, RMA, am acting as Energy manager for [employer], DO.  I have reviewed the above documentation for accuracy and completeness, and I agree with the above. Paula Capra, DO

## 2021-12-22 ENCOUNTER — Encounter (INDEPENDENT_AMBULATORY_CARE_PROVIDER_SITE_OTHER): Payer: Self-pay | Admitting: Bariatrics

## 2021-12-31 ENCOUNTER — Other Ambulatory Visit (HOSPITAL_BASED_OUTPATIENT_CLINIC_OR_DEPARTMENT_OTHER): Payer: Self-pay

## 2021-12-31 MED ORDER — SCOPOLAMINE 1 MG/3DAYS TD PT72
MEDICATED_PATCH | TRANSDERMAL | 0 refills | Status: DC
  Filled 2021-12-31: qty 9, 30d supply, fill #0

## 2022-01-03 ENCOUNTER — Ambulatory Visit (INDEPENDENT_AMBULATORY_CARE_PROVIDER_SITE_OTHER): Payer: No Typology Code available for payment source | Admitting: Family Medicine

## 2022-01-05 ENCOUNTER — Ambulatory Visit (INDEPENDENT_AMBULATORY_CARE_PROVIDER_SITE_OTHER): Payer: No Typology Code available for payment source | Admitting: Physician Assistant

## 2022-01-05 ENCOUNTER — Encounter: Payer: Self-pay | Admitting: Physician Assistant

## 2022-01-05 ENCOUNTER — Other Ambulatory Visit (HOSPITAL_BASED_OUTPATIENT_CLINIC_OR_DEPARTMENT_OTHER): Payer: Self-pay

## 2022-01-05 DIAGNOSIS — F331 Major depressive disorder, recurrent, moderate: Secondary | ICD-10-CM

## 2022-01-05 DIAGNOSIS — F902 Attention-deficit hyperactivity disorder, combined type: Secondary | ICD-10-CM

## 2022-01-05 DIAGNOSIS — F3281 Premenstrual dysphoric disorder: Secondary | ICD-10-CM

## 2022-01-05 DIAGNOSIS — F411 Generalized anxiety disorder: Secondary | ICD-10-CM | POA: Diagnosis not present

## 2022-01-05 MED ORDER — ESCITALOPRAM OXALATE 20 MG PO TABS
40.0000 mg | ORAL_TABLET | Freq: Every day | ORAL | 1 refills | Status: DC
Start: 2022-01-05 — End: 2022-04-22
  Filled 2022-01-05 – 2022-01-18 (×2): qty 180, 90d supply, fill #0
  Filled 2022-04-15: qty 180, 90d supply, fill #1

## 2022-01-05 MED ORDER — AMPHETAMINE-DEXTROAMPHETAMINE 30 MG PO TABS
ORAL_TABLET | ORAL | 0 refills | Status: DC
  Filled 2022-01-05: qty 30, 30d supply, fill #0

## 2022-01-05 MED ORDER — SERTRALINE HCL 25 MG PO TABS
ORAL_TABLET | ORAL | 1 refills | Status: DC
  Filled 2022-01-05: qty 30, 30d supply, fill #0

## 2022-01-05 NOTE — Progress Notes (Signed)
[facility]  Patient ID: Paula Burke,  MRN: [id number]  PCP: Paula Huxley, PA  Date of Evaluation: 01/05/2022 Time spent:30 minutes  Chief Complaint:  Chief Complaint   Anxiety; Depression; ADHD; Follow-up     HISTORY/CURRENT STATUS: HPI For routine med check.  A month ago, we increased Lexapro. Not as anxious, that has improved about 50% since increasing the Lexapro. Doesn't go from "1-100" as quickly. Has less of a sense of dread. No PA.   Still has a hard time enjoying things but that too is a little better. Energy and motivation are fair to good, depending on the day. Looking forward to going on a cruise next week. Work is going fine. ADLs and personal hygiene are nl. Appetite is normal but she does binge eat sometimes. No purging. No SI/HI.  States that attention is good without easy distractibility.  Able to focus on things and finish tasks to completion.   C/O symptoms of PMS.  Her periods are a little bit irregular, likely premenopausal but she can tell when she is getting close to having 1 because of the moodiness she feels.  It is not debilitating just very frustrating.  Has had 1 day in the past month when she didn't sleep all night but didn't miss it the next day. Occasionally has times like that before, but not assoc w/ any other manic sx.  No increased talkativeness, no racing thoughts, no impulsivity or risky behaviors, no increased spending, no increased libido, no grandiosity, no increased irritability or anger, no paranoia, and no hallucinations.  Denies dizziness, syncope, seizures, numbness, tingling, tremor, tics, unsteady gait, slurred speech, confusion. Denies muscle or joint pain, stiffness, or dystonia.   Individual Medical History/ Review of Systems: Changes? :No   Past medications for mental health diagnoses include: Adderall XR and IR, Wellbutrin XL (at one point she was on it alone but d/t anxiety dose was decreased and Lexapro added a  few years ago) Lexapro, Zoloft caused sexual side effects, Effexor, Trintellix (didn't take long) Metadate, Vyvanse, Melatonin, Ativan, Xanax, Trazodone    Allergies: Patient has no known allergies.  Current Medications:  Current Outpatient Medications:    Acetylcysteine 600 MG CAPS, Take 600 mg by mouth in the morning., Disp: , Rfl:    amphetamine-dextroamphetamine (ADDERALL) 30 MG tablet, Take 0.5 tablets by mouth 2 (two) times daily., Disp: 30 tablet, Rfl: 0   buPROPion (WELLBUTRIN XL) 150 MG 24 hr tablet, 1 tablet in the morning Orally Once a day 90 days, Disp: 90 tablet, Rfl: 2   LORazepam (ATIVAN) 0.5 MG tablet, Take 0.5-1 tablets (0.25-0.5 mg total) by mouth every 8 (eight) hours as needed for anxiety., Disp: 30 tablet, Rfl: 1   magnesium gluconate (MAGONATE) 500 MG tablet, Take 240 mg by mouth 2 (two) times daily. 240 mg twice nightly, Disp: , Rfl:    Melatonin 2.5 MG CHEW, Chew 2.5 mg by mouth., Disp: , Rfl:    Multiple Vitamin (MULTI-VITAMIN) tablet, Take 1 tablet by mouth daily., Disp: , Rfl:    Multiple Vitamins-Minerals (AIRBORNE) TBEF, See admin instructions., Disp: , Rfl:    sertraline (ZOLOFT) 25 MG tablet, 1 po qd 5 days pre-menstrually, then stop when menses begins., Disp: 30 tablet, Rfl: 1   spironolactone (ALDACTONE) 50 MG tablet, 3 tablets Orally Once a day 90 days, Disp: 270 tablet, Rfl: 3   tretinoin (RETIN-A) 0.1 % cream, 1 application in the evening to face Externally Once at night 30 days, Disp: 45  g, Rfl: 3   valACYclovir (VALTREX) 1000 MG tablet, 1 tablet Orally Once a day 90 days, Disp: 90 tablet, Rfl: 3   amphetamine-dextroamphetamine (ADDERALL) 30 MG tablet, Take 0.5 tablet by mouth 2 (two) times daily., Disp: 30 tablet, Rfl: 0   COVID-19 At Home Antigen Test (CARESTART COVID-19 HOME TEST) KIT, Use as directed within package instructions (Patient not taking: Reported on 01/05/2022), Disp: 4 kit, Rfl: 0   escitalopram (LEXAPRO) 20 MG tablet, Take 2 tablets (40 mg  total) by mouth daily., Disp: 180 tablet, Rfl: 1   Omega-3 Fatty Acids (FISH OIL) 1000 MG CAPS, Take by mouth. (Patient not taking: Reported on 01/05/2022), Disp: , Rfl:    scopolamine (TRANSDERM-SCOP) 1 MG/3DAYS, Apply 1 patch to skin behind the ear as needed (Patient not taking: Reported on 01/05/2022), Disp: 9 patch, Rfl: 0 Medication Side Effects: none  Family Medical/ Social History: Changes? No  MENTAL HEALTH EXAM:  currently breastfeeding.There is no height or weight on file to calculate BMI.  General Appearance: Casual and Well Groomed  Eye Contact:  Good  Speech:  Clear and Coherent and Normal Rate  Volume:  Normal  Mood:  Euthymic  Affect:  Congruent  Thought Process:  Goal Directed and Descriptions of Associations: Circumstantial  Orientation:  Full (Time, Place, and Person)  Thought Content: Obsessions and Rumination   Suicidal Thoughts:  No  Homicidal Thoughts:  No  Memory:  WNL  Judgement:  Good  Insight:  Good  Psychomotor Activity:  Normal  Concentration:  Concentration: Good  Recall:  Good  Fund of Knowledge: Good  Language: Good  Assets:  Desire for Improvement  ADL's:  Intact  Cognition: WNL  Prognosis:  Good    DIAGNOSES:    ICD-10-CM   1. Major depressive disorder, recurrent episode, moderate (HCC)  F33.1     2. Generalized anxiety disorder  F41.1     3. Attention deficit hyperactivity disorder (ADHD), combined type  F90.2     4. PMDD (premenstrual dysphoric disorder)  F32.81       Receiving Psychotherapy: Yes    with Evelena Peat Deussing   RECOMMENDATIONS:  PDMP reviewed.  Adderall filled 12/03/2021.  Ativan filled 10/20/2021. I provided 30 minutes of face to face time during this encounter, including time spent before and after the visit in records review, medical decision making, counseling pertinent to today's visit, and charting.  I am glad she is doing better with the anxiety as well as the symptoms of depression.  She has been on the higher dose  of Lexapro for 4 weeks so I will not make any changes at this point, she may notice even more of an improvement over the next 2 to 4 weeks.  She verbalizes understanding. We discussed the PMDD.  Recommend adding low-dose Zoloft beginning approximately 5 days before menses begins.  Since she is not totally regular with her cycles, recommend she start the Zoloft when she starts feeling extremely moody, but stopped the Zoloft when she does start.  She verbalizes understanding.  Continue NAC 600 mg, 1 p.o. daily or twice daily. Continue Adderall 30 mg, one half p.o. twice a day. Continue Wellbutrin XL 150 mg daily. Continue Lexapro 20 mg, 2 p.o. daily. Continue Ativan 0.5 mg, 1/2-1 3 times daily as needed severe anxiety.  Take sparingly. Start Zoloft 25 mg, 1 p.o. daily beginning approximately 5 days before menses begins and then stop once menses begins. Continue multivitamin, fish oil, B complex Continue therapy with Evelena Peat Deussing.  Return in 2 months.  Donnal Moat, PA-C

## 2022-01-07 ENCOUNTER — Other Ambulatory Visit (HOSPITAL_BASED_OUTPATIENT_CLINIC_OR_DEPARTMENT_OTHER): Payer: Self-pay

## 2022-01-07 MED ORDER — AMOXICILLIN 500 MG PO CAPS
ORAL_CAPSULE | ORAL | 0 refills | Status: DC
  Filled 2022-01-07: qty 20, 10d supply, fill #0

## 2022-01-19 ENCOUNTER — Other Ambulatory Visit (HOSPITAL_BASED_OUTPATIENT_CLINIC_OR_DEPARTMENT_OTHER): Payer: Self-pay

## 2022-02-03 ENCOUNTER — Ambulatory Visit (INDEPENDENT_AMBULATORY_CARE_PROVIDER_SITE_OTHER): Payer: No Typology Code available for payment source | Admitting: Family Medicine

## 2022-02-09 ENCOUNTER — Other Ambulatory Visit (HOSPITAL_BASED_OUTPATIENT_CLINIC_OR_DEPARTMENT_OTHER): Payer: Self-pay

## 2022-02-14 ENCOUNTER — Other Ambulatory Visit (HOSPITAL_COMMUNITY): Payer: Self-pay

## 2022-02-15 ENCOUNTER — Other Ambulatory Visit: Payer: Self-pay | Admitting: Physician Assistant

## 2022-02-16 ENCOUNTER — Other Ambulatory Visit (HOSPITAL_BASED_OUTPATIENT_CLINIC_OR_DEPARTMENT_OTHER): Payer: Self-pay

## 2022-02-16 MED ORDER — AMPHETAMINE-DEXTROAMPHETAMINE 30 MG PO TABS
ORAL_TABLET | ORAL | 0 refills | Status: DC
  Filled 2022-02-16: qty 30, 30d supply, fill #0

## 2022-02-16 NOTE — Telephone Encounter (Signed)
Filled 6/22

## 2022-02-23 ENCOUNTER — Encounter (INDEPENDENT_AMBULATORY_CARE_PROVIDER_SITE_OTHER): Payer: Self-pay

## 2022-03-02 ENCOUNTER — Ambulatory Visit (INDEPENDENT_AMBULATORY_CARE_PROVIDER_SITE_OTHER): Payer: No Typology Code available for payment source | Admitting: Family Medicine

## 2022-03-11 ENCOUNTER — Other Ambulatory Visit (HOSPITAL_BASED_OUTPATIENT_CLINIC_OR_DEPARTMENT_OTHER): Payer: Self-pay

## 2022-03-11 ENCOUNTER — Ambulatory Visit (INDEPENDENT_AMBULATORY_CARE_PROVIDER_SITE_OTHER): Payer: No Typology Code available for payment source | Admitting: Physician Assistant

## 2022-03-11 ENCOUNTER — Encounter: Payer: Self-pay | Admitting: Physician Assistant

## 2022-03-11 DIAGNOSIS — F3341 Major depressive disorder, recurrent, in partial remission: Secondary | ICD-10-CM | POA: Diagnosis not present

## 2022-03-11 DIAGNOSIS — F411 Generalized anxiety disorder: Secondary | ICD-10-CM | POA: Diagnosis not present

## 2022-03-11 DIAGNOSIS — F902 Attention-deficit hyperactivity disorder, combined type: Secondary | ICD-10-CM | POA: Diagnosis not present

## 2022-03-11 DIAGNOSIS — F422 Mixed obsessional thoughts and acts: Secondary | ICD-10-CM | POA: Diagnosis not present

## 2022-03-11 MED ORDER — AMPHETAMINE-DEXTROAMPHETAMINE 30 MG PO TABS
15.0000 mg | ORAL_TABLET | Freq: Two times a day (BID) | ORAL | 0 refills | Status: DC
  Filled 2022-03-18: qty 30, 30d supply, fill #0

## 2022-03-11 MED ORDER — BUPROPION HCL ER (XL) 300 MG PO TB24
300.0000 mg | ORAL_TABLET | Freq: Every day | ORAL | 1 refills | Status: DC
  Filled 2022-03-11: qty 90, 90d supply, fill #0

## 2022-03-11 NOTE — Progress Notes (Signed)
[facility]  Patient ID: Paula Burke,  MRN: [id number]  PCP: [name], PA  Date of Evaluation: 03/11/2022 Time spent:20 minutes  Chief Complaint:  Chief Complaint   Anxiety; Depression; Follow-up    HISTORY/CURRENT STATUS: HPI For routine med check.  Hasn't been able to try the Zoloft b/c menses started a month ago and hasn't stopped. Hasn't talked with her GYN about it.   Has no motivation or energy. "I know what I need to do but I can't make myself do things. I procrastinate and get distracted. At the end of the day, I don't have much to show for my time." Affects her work.   Patient doesn't do much except work. Takes her kids to the movies and goes out with a friend usually once a month.  Work can be overwhelming.  No extreme sadness, tearfulness, or feelings of hopelessness.  Sleeps well most of the time.  Doesn't want to sleep during the day. ADLs and personal hygiene are normal.   Appetite has not changed.  Weight is stable.  Denies laxative use, calorie restricting, or binging and purging.   Denies cutting or any form of self-harm.  There are times when she gets anxious but not often.  Takes the Ativan very rarely.  Denies suicidal or homicidal thoughts.  Got very angry the other day b/c her dtr didn't make the volleyball team.  Not really sure why it affected her so much.  Feels like there may have been something in her life in middle school that she has not been able to work through and she felt a lot of rejection on behalf of her daughter.  She has not been in counseling in a while.  Patient denies increased energy with decreased need for sleep, increased talkativeness, racing thoughts, impulsivity or risky behaviors, increased spending, increased libido, grandiosity, increased irritability or anger, paranoia, and no hallucinations.  Denies dizziness, syncope, seizures, numbness, tingling, tremor, tics, unsteady gait, slurred speech, confusion. Denies muscle or  joint pain, stiffness, or dystonia.   Individual Medical History/ Review of Systems: Changes? :No   Past medications for mental health diagnoses include: Adderall XR and IR, Wellbutrin XL (at one point she was on it alone but d/t anxiety dose was decreased and Lexapro added a few years ago) Lexapro, Zoloft caused sexual side effects, Effexor, Trintellix (didn't take long) Metadate, Vyvanse, Melatonin, Ativan, Xanax, Trazodone    Allergies: Patient has no known allergies.  Current Medications:  Current Outpatient Medications:    Acetylcysteine 600 MG CAPS, Take 600 mg by mouth in the morning., Disp: , Rfl:    amphetamine-dextroamphetamine (ADDERALL) 30 MG tablet, Take 0.5 tablet by mouth 2 (two) times daily., Disp: 30 tablet, Rfl: 0   buPROPion (WELLBUTRIN XL) 150 MG 24 hr tablet, 1 tablet in the morning Orally Once a day 90 days, Disp: 90 tablet, Rfl: 2   buPROPion (WELLBUTRIN XL) 300 MG 24 hr tablet, Take 1 tablet (300 mg total) by mouth daily., Disp: 90 tablet, Rfl: 1   COLLAGEN PO, Take by mouth., Disp: , Rfl:    escitalopram (LEXAPRO) 20 MG tablet, Take 2 tablets (40 mg total) by mouth daily., Disp: 180 tablet, Rfl: 1   LORazepam (ATIVAN) 0.5 MG tablet, Take 0.5-1 tablets (0.25-0.5 mg total) by mouth every 8 (eight) hours as needed for anxiety., Disp: 30 tablet, Rfl: 1   magnesium gluconate (MAGONATE) 500 MG tablet, Take 240 mg by mouth 2 (two) times daily. 240 mg twice nightly,  Disp: , Rfl:    Melatonin 2.5 MG CHEW, Chew 2.5 mg by mouth., Disp: , Rfl:    Multiple Vitamin (MULTI-VITAMIN) tablet, Take 1 tablet by mouth daily., Disp: , Rfl:    Multiple Vitamins-Minerals (AIRBORNE) TBEF, See admin instructions., Disp: , Rfl:    Omega-3 Fatty Acids (FISH OIL) 1000 MG CAPS, Take by mouth., Disp: , Rfl:    spironolactone (ALDACTONE) 50 MG tablet, Take 3 tablets by mouth once a day, Disp: 270 tablet, Rfl: 3   tretinoin (RETIN-A) 0.1 % cream, 1 application in the evening to face Externally Once  at night 30 days, Disp: 45 g, Rfl: 3   valACYclovir (VALTREX) 1000 MG tablet, 1 tablet Orally Once a day 90 days, Disp: 90 tablet, Rfl: 3   [START ON 03/18/2022] amphetamine-dextroamphetamine (ADDERALL) 30 MG tablet, Take 0.5 tablets by mouth 2 (two) times daily., Disp: 30 tablet, Rfl: 0   COVID-19 At Home Antigen Test (CARESTART COVID-19 HOME TEST) KIT, Use as directed within package instructions (Patient not taking: Reported on 01/05/2022), Disp: 4 kit, Rfl: 0   sertraline (ZOLOFT) 25 MG tablet, 1 po qd 5 days pre-menstrually, then stop when menses begins. (Patient not taking: Reported on 03/11/2022), Disp: 30 tablet, Rfl: 1 Medication Side Effects:  sweating  Family Medical/ Social History: Changes? No  MENTAL HEALTH EXAM:  currently breastfeeding.There is no height or weight on file to calculate BMI.  General Appearance: Casual and Well Groomed  Eye Contact:  Good  Speech:  Clear and Coherent and Normal Rate  Volume:  Normal  Mood:  Euthymic  Affect:  Congruent  Thought Process:  Goal Directed and Descriptions of Associations: Circumstantial  Orientation:  Full (Time, Place, and Person)  Thought Content: Logical, Obsessions, and Rumination   Suicidal Thoughts:  No  Homicidal Thoughts:  No  Memory:  WNL  Judgement:  Good  Insight:  Good  Psychomotor Activity:  Normal  Concentration:  Concentration: Good and Attention Span: Fair  Recall:  Good  Fund of Knowledge: Good  Language: Good  Assets:  Desire for Improvement  ADL's:  Intact  Cognition: WNL  Prognosis:  Good   DIAGNOSES:    ICD-10-CM   1. Recurrent major depressive disorder, in partial remission ([place])  F33.41     2. Mixed obsessional thoughts and acts  F42.2     3. Generalized anxiety disorder  F41.1     4. Attention deficit hyperactivity disorder (ADHD), combined type  F90.2      Receiving Psychotherapy: No    hasn't seen Evelena Peat Deussing in [place].  RECOMMENDATIONS:  PDMP reviewed.  Adderall filled 02/16/2022.   Ativan filled 10/20/2021. I provided 20 minutes of face to face time during this encounter, including time spent before and after the visit in records review, medical decision making, counseling pertinent to today's visit, and charting.   Needs to see her GYN concerning menorrhagia.   Recommend increasing Wellbutrin to help w/ motivation and energy. She'd like to try it. May need to increase the Adderall but will wait and see how she does with Wellbutrin change first.   Continue NAC 600 mg, 1 p.o. daily or twice daily. Continue Adderall 30 mg, 1/2 p.o. twice a day. Increase Wellbutrin XL to 300 mg p.o. every morning. Continue Lexapro 20 mg, 2 p.o. daily. Continue Ativan 0.5 mg, 1/2-1 3 times daily as needed severe anxiety.  Take sparingly. Continue Zoloft 25 mg, 1 p.o. daily beginning approximately 5 days before menses begins and then stop once  menses begins. Continue multivitamin, fish oil, B complex, NAC Get back in with [facility]. Return in 5-6 wks.   Donnal Moat, PA-C

## 2022-03-18 ENCOUNTER — Other Ambulatory Visit (HOSPITAL_BASED_OUTPATIENT_CLINIC_OR_DEPARTMENT_OTHER): Payer: Self-pay

## 2022-03-24 ENCOUNTER — Encounter (HOSPITAL_BASED_OUTPATIENT_CLINIC_OR_DEPARTMENT_OTHER): Payer: Self-pay | Admitting: Pharmacist

## 2022-03-24 ENCOUNTER — Other Ambulatory Visit (HOSPITAL_BASED_OUTPATIENT_CLINIC_OR_DEPARTMENT_OTHER): Payer: Self-pay

## 2022-03-24 MED ORDER — WEGOVY 0.25 MG/0.5ML ~~LOC~~ SOAJ
0.2500 mg | SUBCUTANEOUS | 0 refills | Status: DC
  Filled 2022-03-24: qty 2, 28d supply, fill #0

## 2022-03-25 ENCOUNTER — Other Ambulatory Visit (HOSPITAL_BASED_OUTPATIENT_CLINIC_OR_DEPARTMENT_OTHER): Payer: Self-pay

## 2022-03-28 ENCOUNTER — Other Ambulatory Visit (HOSPITAL_BASED_OUTPATIENT_CLINIC_OR_DEPARTMENT_OTHER): Payer: Self-pay

## 2022-03-29 ENCOUNTER — Other Ambulatory Visit (HOSPITAL_BASED_OUTPATIENT_CLINIC_OR_DEPARTMENT_OTHER): Payer: Self-pay

## 2022-03-31 ENCOUNTER — Other Ambulatory Visit (HOSPITAL_BASED_OUTPATIENT_CLINIC_OR_DEPARTMENT_OTHER): Payer: Self-pay

## 2022-04-04 IMAGING — CR DG FOOT COMPLETE 3+V*L*
3 series · 3 of 3 positions shown · non-contrast
Comparison: None.

CLINICAL DATA: Left toe pain

EXAM:
LEFT FOOT - COMPLETE 3+ VIEW

[x foot ap left]
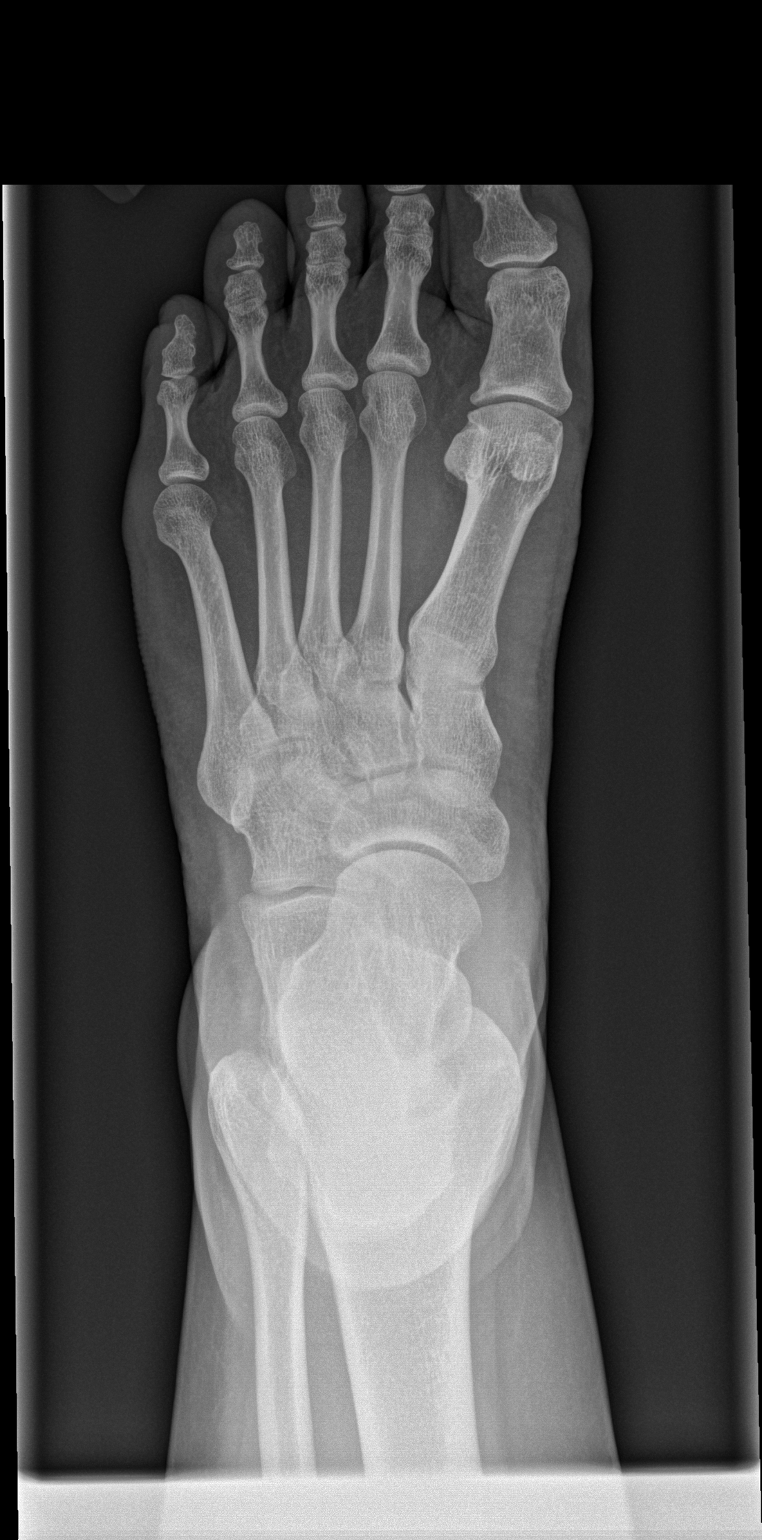

[x foot obl left]
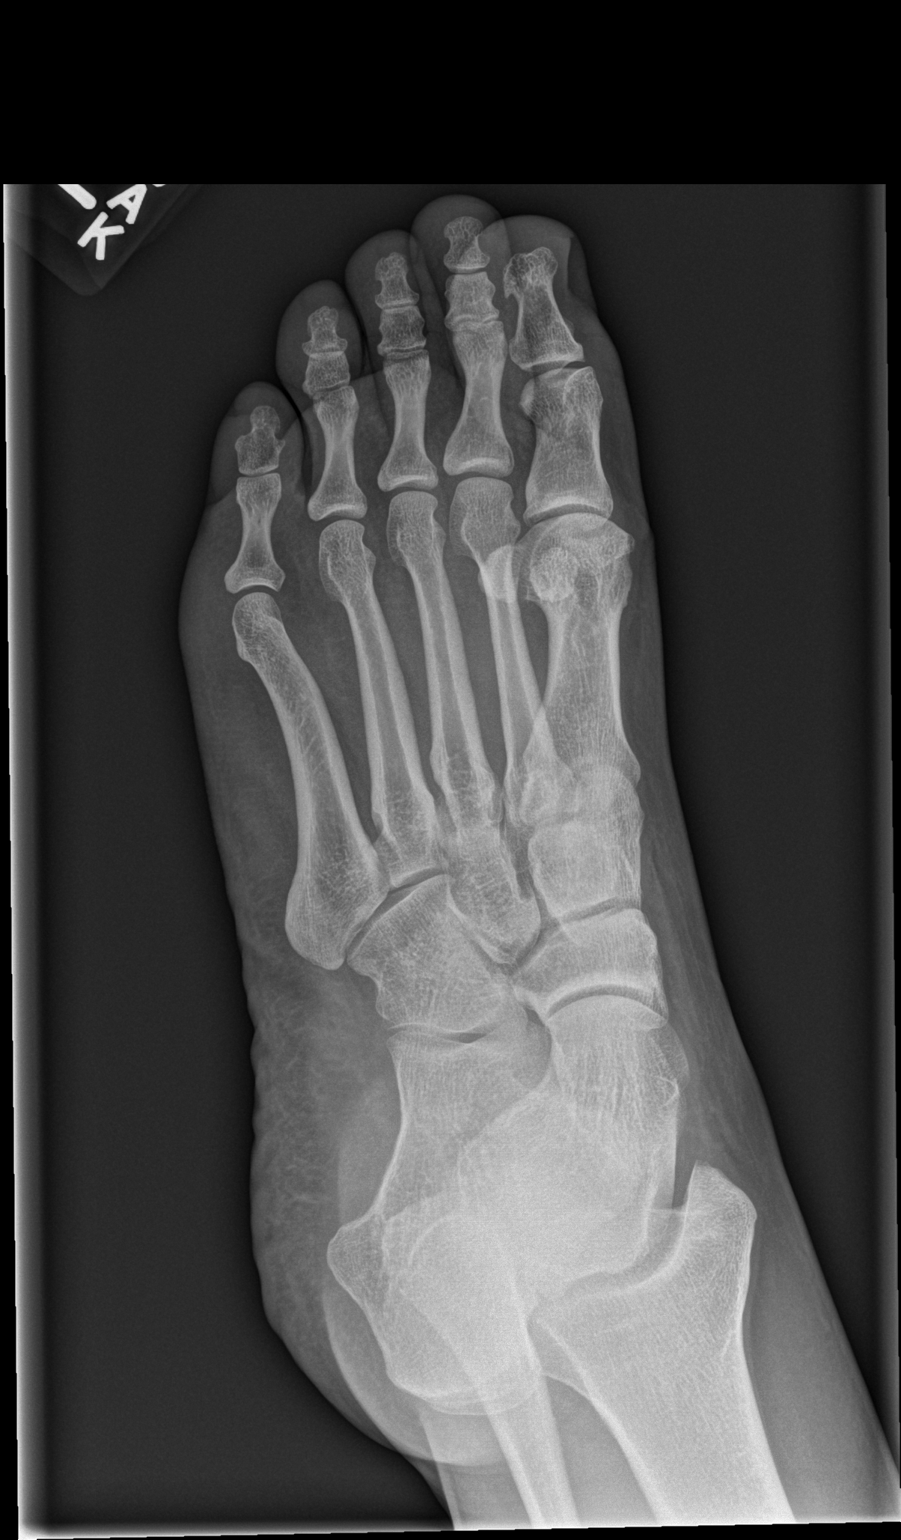

[x foot lat left]
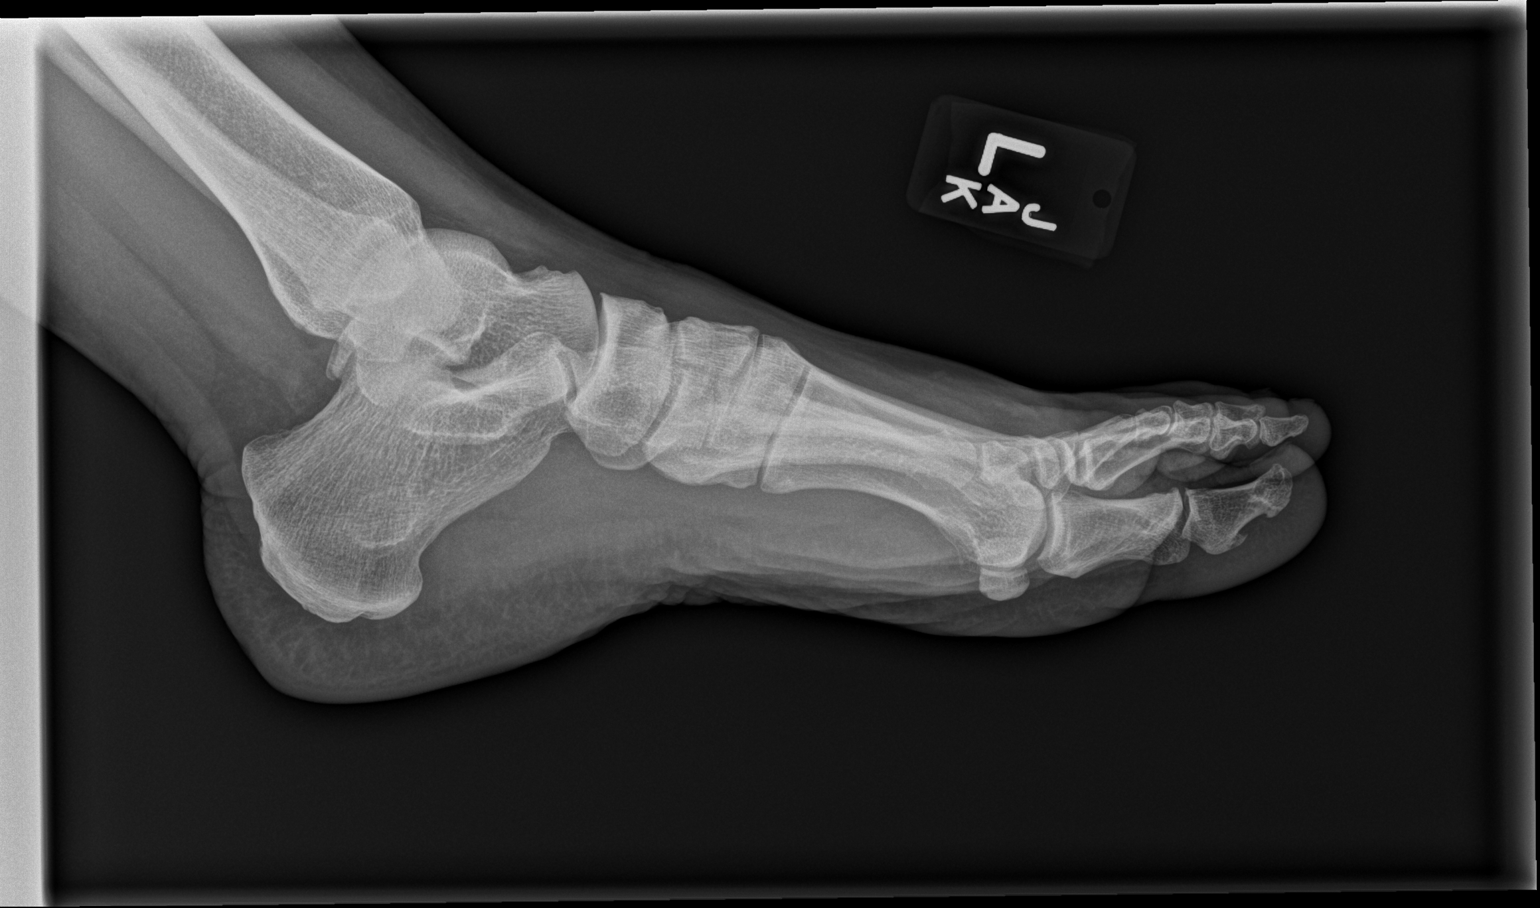

[3 of 3 positions shown; findings below may reference images not displayed]

FINDINGS: No fracture or malalignment. Mild degenerative change at the first
MTP joint.
IMPRESSION: No acute osseous abnormality. Mild degenerative change at the first
MTP joint.

## 2022-04-06 ENCOUNTER — Other Ambulatory Visit (HOSPITAL_BASED_OUTPATIENT_CLINIC_OR_DEPARTMENT_OTHER): Payer: Self-pay

## 2022-04-08 ENCOUNTER — Other Ambulatory Visit (HOSPITAL_BASED_OUTPATIENT_CLINIC_OR_DEPARTMENT_OTHER): Payer: Self-pay

## 2022-04-11 ENCOUNTER — Encounter (HOSPITAL_BASED_OUTPATIENT_CLINIC_OR_DEPARTMENT_OTHER): Payer: Self-pay

## 2022-04-11 ENCOUNTER — Other Ambulatory Visit (HOSPITAL_BASED_OUTPATIENT_CLINIC_OR_DEPARTMENT_OTHER): Payer: Self-pay

## 2022-04-11 MED ORDER — VALACYCLOVIR HCL 1 G PO TABS
1000.0000 mg | ORAL_TABLET | Freq: Every day | ORAL | 3 refills | Status: DC
Start: 2022-04-11 — End: 2023-03-21
  Filled 2022-04-11: qty 90, 90d supply, fill #0
  Filled 2022-07-06: qty 90, 90d supply, fill #1
  Filled 2022-10-03: qty 90, 90d supply, fill #2
  Filled 2022-12-06 – 2022-12-15 (×2): qty 90, 90d supply, fill #3

## 2022-04-12 ENCOUNTER — Other Ambulatory Visit (HOSPITAL_BASED_OUTPATIENT_CLINIC_OR_DEPARTMENT_OTHER): Payer: Self-pay

## 2022-04-15 ENCOUNTER — Other Ambulatory Visit (HOSPITAL_BASED_OUTPATIENT_CLINIC_OR_DEPARTMENT_OTHER): Payer: Self-pay

## 2022-04-21 ENCOUNTER — Other Ambulatory Visit (HOSPITAL_BASED_OUTPATIENT_CLINIC_OR_DEPARTMENT_OTHER): Payer: Self-pay

## 2022-04-22 ENCOUNTER — Other Ambulatory Visit (HOSPITAL_BASED_OUTPATIENT_CLINIC_OR_DEPARTMENT_OTHER): Payer: Self-pay

## 2022-04-22 ENCOUNTER — Ambulatory Visit (INDEPENDENT_AMBULATORY_CARE_PROVIDER_SITE_OTHER): Payer: No Typology Code available for payment source | Admitting: Physician Assistant

## 2022-04-22 ENCOUNTER — Encounter: Payer: Self-pay | Admitting: Physician Assistant

## 2022-04-22 DIAGNOSIS — F3341 Major depressive disorder, recurrent, in partial remission: Secondary | ICD-10-CM | POA: Diagnosis not present

## 2022-04-22 DIAGNOSIS — F411 Generalized anxiety disorder: Secondary | ICD-10-CM

## 2022-04-22 DIAGNOSIS — F902 Attention-deficit hyperactivity disorder, combined type: Secondary | ICD-10-CM | POA: Diagnosis not present

## 2022-04-22 DIAGNOSIS — F422 Mixed obsessional thoughts and acts: Secondary | ICD-10-CM

## 2022-04-22 MED ORDER — AMPHETAMINE-DEXTROAMPHETAMINE 30 MG PO TABS
15.0000 mg | ORAL_TABLET | Freq: Two times a day (BID) | ORAL | 0 refills | Status: DC
  Filled 2022-06-29: qty 30, 30d supply, fill #0

## 2022-04-22 MED ORDER — ESCITALOPRAM OXALATE 20 MG PO TABS
40.0000 mg | ORAL_TABLET | Freq: Every day | ORAL | 1 refills | Status: DC
Start: 2022-04-22 — End: 2022-07-22
  Filled 2022-04-22 – 2022-07-08 (×2): qty 180, 90d supply, fill #0

## 2022-04-22 MED ORDER — B COMPLEX VITAMINS PO CAPS: 1.0000 | ORAL_CAPSULE | Freq: Every day | ORAL | Status: AC

## 2022-04-22 MED ORDER — VITAMIN D 50 MCG (2000 UT) PO CAPS
2000.0000 [IU] | ORAL_CAPSULE | Freq: Every day | ORAL | Status: AC
Start: 2022-04-22 — End: ?

## 2022-04-22 MED ORDER — AMPHETAMINE-DEXTROAMPHETAMINE 30 MG PO TABS
15.0000 mg | ORAL_TABLET | Freq: Two times a day (BID) | ORAL | 0 refills | Status: DC
  Filled 2022-05-23: qty 30, 30d supply, fill #0

## 2022-04-22 MED ORDER — AMPHETAMINE-DEXTROAMPHETAMINE 30 MG PO TABS
15.0000 mg | ORAL_TABLET | Freq: Two times a day (BID) | ORAL | 0 refills | Status: DC
  Filled 2022-04-22: qty 30, 30d supply, fill #0

## 2022-04-22 NOTE — Progress Notes (Signed)
[facility]  Patient ID: Paula Burke,  MRN: [id number]  PCP: [name], PA  Date of Evaluation: 04/22/2022 Time spent:20 minutes  Chief Complaint:  Chief Complaint   Anxiety; Depression; ADD; Insomnia; Follow-up    HISTORY/CURRENT STATUS: HPI For routine med check.  Wellbutrin was increased 6 wks ago. Is doing 50-75% better. Feels like her 'insides are stable.' Doesn't feel like any meds need to be changed. She's working on lifestyle changes now, has put sleep a priority now, feels more rested when she gets up.  Working on a healthier diet and exercise too. Patient is able to enjoy things.  Energy and motivation are much better.  Work is going well.   No extreme sadness, tearfulness, or feelings of hopelessness.   ADLs and personal hygiene are normal.   Appetite has not changed.  Weight is stable.  Not isolating.  Not crying easily.  Not having anxiety very much at all.  Not obsessing about things now.  Denies suicidal or homicidal thoughts.  States that attention is good without easy distractibility.  Able to focus on things and finish tasks to completion.   Has not taken Zoloft, she got a new IUD placed and may not need the Zoloft for PMDD.  Reports irritability sometimes but feels like it is life circumstances.  Patient denies increased energy with decreased need for sleep, increased talkativeness, racing thoughts, impulsivity or risky behaviors, increased spending, increased libido, grandiosity, paranoia, or hallucinations.  Denies dizziness, syncope, seizures, numbness, tingling, tremor, tics, unsteady gait, slurred speech, confusion. Denies muscle or joint pain, stiffness, or dystonia.   Individual Medical History/ Review of Systems: Changes? :No   Past medications for mental health diagnoses include: Adderall XR and IR, Wellbutrin XL (at one point she was on it alone but d/t anxiety dose was decreased and Lexapro added a few years ago) Lexapro, Zoloft caused  sexual side effects, Effexor, Trintellix (didn't take long) Metadate, Vyvanse, Melatonin, Ativan, Xanax, Trazodone    Allergies: Patient has no known allergies.  Current Medications:  Current Outpatient Medications:    Acetylcysteine 600 MG CAPS, Take 600 mg by mouth in the morning., Disp: , Rfl:    amphetamine-dextroamphetamine (ADDERALL) 30 MG tablet, Take 0.5 tablets by mouth 2 (two) times daily., Disp: 30 tablet, Rfl: 0   b complex vitamins capsule, Take 1 capsule by mouth daily., Disp: 30 capsule, Rfl:    buPROPion (WELLBUTRIN XL) 300 MG 24 hr tablet, Take 1 tablet (300 mg total) by mouth daily., Disp: 90 tablet, Rfl: 1   Cholecalciferol (VITAMIN D) 50 MCG (2000 UT) CAPS, Take 1 capsule (2,000 Units total) by mouth daily., Disp: 30 capsule, Rfl:    COLLAGEN PO, Take by mouth., Disp: , Rfl:    levonorgestrel (LILETTA, 52 MG,) 20.1 MCG/DAY IUD IUD, Take 1 device by intrauterine route., Disp: , Rfl:    LORazepam (ATIVAN) 0.5 MG tablet, Take 0.5-1 tablets (0.25-0.5 mg total) by mouth every 8 (eight) hours as needed for anxiety., Disp: 30 tablet, Rfl: 1   magnesium gluconate (MAGONATE) 500 MG tablet, Take 240 mg by mouth 2 (two) times daily. 240 mg twice nightly, Disp: , Rfl:    Melatonin 2.5 MG CHEW, Chew 2.5 mg by mouth., Disp: , Rfl:    Multiple Vitamin (MULTI-VITAMIN) tablet, Take 1 tablet by mouth daily., Disp: , Rfl:    Multiple Vitamins-Minerals (AIRBORNE) TBEF, See admin instructions., Disp: , Rfl:    Omega-3 Fatty Acids (FISH OIL) 1000 MG CAPS, Take by  mouth., Disp: , Rfl:    Semaglutide-Weight Management (WEGOVY) 0.25 MG/0.5ML SOAJ, Inject 0.$RemoveBefore'25mg'[id number]$  under the skin once weekly., Disp: 2 mL, Rfl: 0   spironolactone (ALDACTONE) 50 MG tablet, Take 3 tablets by mouth once a day, Disp: 270 tablet, Rfl: 3   tretinoin (RETIN-A) 0.1 % cream, 1 application in the evening to face Externally Once at night 30 days, Disp: 45 g, Rfl: 3   valACYclovir (VALTREX) 1000 MG tablet, Take 1 tablet (1,000 mg  total) by mouth daily., Disp: 90 tablet, Rfl: 3   [START ON 06/20/2022] amphetamine-dextroamphetamine (ADDERALL) 30 MG tablet, Take 0.5 tablets by mouth 2 (two) times daily., Disp: 30 tablet, Rfl: 0   [START ON 05/22/2022] amphetamine-dextroamphetamine (ADDERALL) 30 MG tablet, Take 0.5 tablets by mouth 2 (two) times daily., Disp: 30 tablet, Rfl: 0   COVID-19 At Home Antigen Test (CARESTART COVID-19 HOME TEST) KIT, Use as directed within package instructions (Patient not taking: Reported on 01/05/2022), Disp: 4 kit, Rfl: 0   escitalopram (LEXAPRO) 20 MG tablet, Take 2 tablets (40 mg total) by mouth daily., Disp: 180 tablet, Rfl: 1   sertraline (ZOLOFT) 25 MG tablet, 1 po qd 5 days pre-menstrually, then stop when menses begins. (Patient not taking: Reported on 04/22/2022), Disp: 30 tablet, Rfl: 1 Medication Side Effects:  sweating  Family Medical/ Social History: Changes? No  MENTAL HEALTH EXAM:  currently breastfeeding.There is no height or weight on file to calculate BMI.  General Appearance: Casual and Well Groomed  Eye Contact:  Good  Speech:  Clear and Coherent and Normal Rate  Volume:  Normal  Mood:  Euthymic  Affect:  Congruent  Thought Process:  Goal Directed and Descriptions of Associations: Circumstantial  Orientation:  Full (Time, Place, and Person)  Thought Content: Logical, Obsessions, and Rumination   Suicidal Thoughts:  No  Homicidal Thoughts:  No  Memory:  WNL  Judgement:  Good  Insight:  Good  Psychomotor Activity:  Normal  Concentration:  Concentration: Good and Attention Span: Good  Recall:  Good  Fund of Knowledge: Good  Language: Good  Assets:  Desire for Improvement  ADL's:  Intact  Cognition: WNL  Prognosis:  Good   DIAGNOSES:    ICD-10-CM   1. Recurrent major depressive disorder, in partial remission ([place])  F33.41     2. Mixed obsessional thoughts and acts  F42.2     3. Generalized anxiety disorder  F41.1     4. Attention deficit hyperactivity disorder  (ADHD), combined type  F90.2       Receiving Psychotherapy: No    hasn't seen Evelena Peat Deussing in [place].  RECOMMENDATIONS:  PDMP reviewed.  Adderall filled 03/18/2022.  Ativan filled 10/20/2021. I provided 20 minutes of face to face time during this encounter, including time spent before and after the visit in records review, medical decision making, counseling pertinent to today's visit, and charting.   She is doing great, no changes are necessary.  Continue NAC 600 mg, 1 p.o. daily or twice daily. Continue Adderall 30 mg, 1/2 p.o. twice a day. Continue Wellbutrin XL 300 mg p.o. every morning. Continue Lexapro 20 mg, 2 p.o. daily. Continue Ativan 0.5 mg, 1/2-1 3 times daily as needed severe anxiety.  Take sparingly. Continue Zoloft 25 mg, 1 p.o. daily beginning approximately 5 days before menses begins and then stop once menses begins. Continue multivitamin, fish oil, B complex, NAC, vitamin D 2000 IUs daily. Get back in with [facility]. Return in 3 months.  Donnal Moat, PA-C

## 2022-04-26 ENCOUNTER — Other Ambulatory Visit (HOSPITAL_BASED_OUTPATIENT_CLINIC_OR_DEPARTMENT_OTHER): Payer: Self-pay

## 2022-04-26 MED ORDER — WEGOVY 0.5 MG/0.5ML ~~LOC~~ SOAJ
0.5000 mg | SUBCUTANEOUS | 0 refills | Status: AC
  Filled 2022-04-26: qty 2, 28d supply, fill #0

## 2022-04-27 ENCOUNTER — Other Ambulatory Visit (HOSPITAL_BASED_OUTPATIENT_CLINIC_OR_DEPARTMENT_OTHER): Payer: Self-pay

## 2022-05-04 ENCOUNTER — Other Ambulatory Visit (HOSPITAL_BASED_OUTPATIENT_CLINIC_OR_DEPARTMENT_OTHER): Payer: Self-pay

## 2022-05-05 ENCOUNTER — Other Ambulatory Visit (HOSPITAL_BASED_OUTPATIENT_CLINIC_OR_DEPARTMENT_OTHER): Payer: Self-pay

## 2022-05-12 ENCOUNTER — Other Ambulatory Visit (HOSPITAL_BASED_OUTPATIENT_CLINIC_OR_DEPARTMENT_OTHER): Payer: Self-pay

## 2022-05-12 MED ORDER — SPIRONOLACTONE 50 MG PO TABS
150.0000 mg | ORAL_TABLET | Freq: Every morning | ORAL | 0 refills | Status: DC
  Filled 2022-05-12: qty 270, 90d supply, fill #0

## 2022-05-12 MED ORDER — WEGOVY 0.25 MG/0.5ML ~~LOC~~ SOAJ
0.2500 mg | SUBCUTANEOUS | 0 refills | Status: AC
  Filled 2022-05-12 – 2022-08-21 (×2): qty 2, 28d supply, fill #0

## 2022-05-13 ENCOUNTER — Other Ambulatory Visit (HOSPITAL_BASED_OUTPATIENT_CLINIC_OR_DEPARTMENT_OTHER): Payer: Self-pay

## 2022-05-13 MED ORDER — INFLUENZA VAC SPLIT QUAD 0.5 ML IM SUSY
PREFILLED_SYRINGE | INTRAMUSCULAR | 0 refills | Status: AC
  Filled 2022-05-13: qty 0.5, 1d supply, fill #0

## 2022-05-23 ENCOUNTER — Other Ambulatory Visit (HOSPITAL_BASED_OUTPATIENT_CLINIC_OR_DEPARTMENT_OTHER): Payer: Self-pay

## 2022-05-25 ENCOUNTER — Other Ambulatory Visit (HOSPITAL_BASED_OUTPATIENT_CLINIC_OR_DEPARTMENT_OTHER): Payer: Self-pay

## 2022-05-30 ENCOUNTER — Other Ambulatory Visit (HOSPITAL_BASED_OUTPATIENT_CLINIC_OR_DEPARTMENT_OTHER): Payer: Self-pay

## 2022-06-02 ENCOUNTER — Other Ambulatory Visit (HOSPITAL_BASED_OUTPATIENT_CLINIC_OR_DEPARTMENT_OTHER): Payer: Self-pay

## 2022-06-03 ENCOUNTER — Telehealth: Payer: Self-pay | Admitting: Physician Assistant

## 2022-06-03 ENCOUNTER — Other Ambulatory Visit (HOSPITAL_BASED_OUTPATIENT_CLINIC_OR_DEPARTMENT_OTHER): Payer: Self-pay

## 2022-06-03 ENCOUNTER — Other Ambulatory Visit: Payer: Self-pay

## 2022-06-03 MED ORDER — BUPROPION HCL ER (XL) 150 MG PO TB24
300.0000 mg | ORAL_TABLET | Freq: Every day | ORAL | 0 refills | Status: DC
  Filled 2022-06-03: qty 90, 45d supply, fill #0

## 2022-06-03 NOTE — Telephone Encounter (Signed)
Rx sent 

## 2022-06-03 NOTE — Telephone Encounter (Signed)
Pt lvm that she would like to go back down on the wellbutrin to 150 mg. She said that the 330 mg is making her more irritable. [facility].Please call her at [phone]

## 2022-06-03 NOTE — Telephone Encounter (Signed)
Ok to send in Rx for Wellbutrin XL 150 mg #90, 1 qd. No RF. Thanks

## 2022-06-03 NOTE — Telephone Encounter (Signed)
Please advise 

## 2022-06-08 ENCOUNTER — Other Ambulatory Visit (HOSPITAL_COMMUNITY): Payer: Self-pay

## 2022-06-13 ENCOUNTER — Other Ambulatory Visit (HOSPITAL_COMMUNITY): Payer: Self-pay

## 2022-06-19 ENCOUNTER — Other Ambulatory Visit (HOSPITAL_BASED_OUTPATIENT_CLINIC_OR_DEPARTMENT_OTHER): Payer: Self-pay

## 2022-06-28 ENCOUNTER — Other Ambulatory Visit (HOSPITAL_BASED_OUTPATIENT_CLINIC_OR_DEPARTMENT_OTHER): Payer: Self-pay

## 2022-06-29 ENCOUNTER — Other Ambulatory Visit (HOSPITAL_BASED_OUTPATIENT_CLINIC_OR_DEPARTMENT_OTHER): Payer: Self-pay

## 2022-07-08 ENCOUNTER — Other Ambulatory Visit: Payer: Self-pay

## 2022-07-08 ENCOUNTER — Other Ambulatory Visit (HOSPITAL_BASED_OUTPATIENT_CLINIC_OR_DEPARTMENT_OTHER): Payer: Self-pay

## 2022-07-11 ENCOUNTER — Other Ambulatory Visit (HOSPITAL_BASED_OUTPATIENT_CLINIC_OR_DEPARTMENT_OTHER): Payer: Self-pay

## 2022-07-22 ENCOUNTER — Encounter: Payer: Self-pay | Admitting: Physician Assistant

## 2022-07-22 ENCOUNTER — Ambulatory Visit (INDEPENDENT_AMBULATORY_CARE_PROVIDER_SITE_OTHER): Payer: 59 | Admitting: Physician Assistant

## 2022-07-22 ENCOUNTER — Other Ambulatory Visit (HOSPITAL_BASED_OUTPATIENT_CLINIC_OR_DEPARTMENT_OTHER): Payer: Self-pay

## 2022-07-22 DIAGNOSIS — F422 Mixed obsessional thoughts and acts: Secondary | ICD-10-CM

## 2022-07-22 DIAGNOSIS — F411 Generalized anxiety disorder: Secondary | ICD-10-CM | POA: Diagnosis not present

## 2022-07-22 DIAGNOSIS — F424 Excoriation (skin-picking) disorder: Secondary | ICD-10-CM

## 2022-07-22 DIAGNOSIS — F902 Attention-deficit hyperactivity disorder, combined type: Secondary | ICD-10-CM

## 2022-07-22 DIAGNOSIS — F3281 Premenstrual dysphoric disorder: Secondary | ICD-10-CM | POA: Diagnosis not present

## 2022-07-22 MED ORDER — AMPHETAMINE-DEXTROAMPHETAMINE 30 MG PO TABS
15.0000 mg | ORAL_TABLET | Freq: Two times a day (BID) | ORAL | 0 refills | Status: DC
  Filled 2022-07-22 – 2022-08-04 (×2): qty 30, 30d supply, fill #0

## 2022-07-22 MED ORDER — FLUVOXAMINE MALEATE 100 MG PO TABS
200.0000 mg | ORAL_TABLET | Freq: Every day | ORAL | 1 refills | Status: DC
  Filled 2022-07-22: qty 60, 30d supply, fill #0
  Filled 2022-08-22: qty 60, 30d supply, fill #1

## 2022-07-22 NOTE — Progress Notes (Unsigned)
[facility]  Patient ID: Paula Burke,  MRN: [id number]  PCP: [name], PA  Date of Evaluation: 07/22/2022 Time spent:20 minutes  Chief Complaint:  Chief Complaint   Anxiety; Depression; Follow-up    HISTORY/CURRENT STATUS: HPI For routine med check.  Picking at her skin much more. Hands, arms, feet. She had been having her nails done which helped prevent her from picking at her nails, but she hasn't been in awhile. Not exercising or eating well like she was doing. Feels like she's 'not doing enough. Doesn't cook or plan meals, or go get groceries, only works, that's it. Does work, but that's all she has the energy for. Procrastinates about work    Denies dizziness, syncope, seizures, numbness, tingling, tremor, tics, unsteady gait, slurred speech, confusion. Denies muscle or joint pain, stiffness, or dystonia.   Individual Medical History/ Review of Systems: Changes? :No   Past medications for mental health diagnoses include: Adderall XR and IR, Wellbutrin XL (at one point she was on it alone but d/t anxiety dose was decreased and Lexapro added a few years ago) Lexapro, Zoloft caused sexual side effects, Effexor, Trintellix (didn't take long) Metadate, Vyvanse, Melatonin, Ativan, Xanax, Trazodone    Allergies: Patient has no known allergies.  Current Medications:  Current Outpatient Medications:    Acetylcysteine 600 MG CAPS, Take 600 mg by mouth in the morning., Disp: , Rfl:    amphetamine-dextroamphetamine (ADDERALL) 30 MG tablet, Take 0.5 tablets by mouth 2 (two) times daily., Disp: 30 tablet, Rfl: 0   amphetamine-dextroamphetamine (ADDERALL) 30 MG tablet, Take 0.5 tablets by mouth 2 (two) times daily., Disp: 30 tablet, Rfl: 0   b complex vitamins capsule, Take 1 capsule by mouth daily., Disp: 30 capsule, Rfl:    buPROPion (WELLBUTRIN XL) 150 MG 24 hr tablet, Take 2 tablets (300 mg total) by mouth daily. (Patient taking differently: Take 150 mg by mouth  daily.), Disp: 90 tablet, Rfl: 0   Cholecalciferol (VITAMIN D) 50 MCG (2000 UT) CAPS, Take 1 capsule (2,000 Units total) by mouth daily., Disp: 30 capsule, Rfl:    COLLAGEN PO, Take by mouth., Disp: , Rfl:    fluvoxaMINE (LUVOX) 100 MG tablet, Take 2 tablets (200 mg total) by mouth at bedtime., Disp: 60 tablet, Rfl: 1   levonorgestrel (LILETTA, 52 MG,) 20.1 MCG/DAY IUD IUD, Take 1 device by intrauterine route., Disp: , Rfl:    LORazepam (ATIVAN) 0.5 MG tablet, Take 0.5-1 tablets (0.25-0.5 mg total) by mouth every 8 (eight) hours as needed for anxiety., Disp: 30 tablet, Rfl: 1   magnesium gluconate (MAGONATE) 500 MG tablet, Take 240 mg by mouth 2 (two) times daily. 240 mg twice nightly, Disp: , Rfl:    Melatonin 2.5 MG CHEW, Chew 2.5 mg by mouth., Disp: , Rfl:    Multiple Vitamin (MULTI-VITAMIN) tablet, Take 1 tablet by mouth daily., Disp: , Rfl:    Multiple Vitamins-Minerals (AIRBORNE) TBEF, See admin instructions., Disp: , Rfl:    Omega-3 Fatty Acids (FISH OIL) 1000 MG CAPS, Take by mouth., Disp: , Rfl:    spironolactone (ALDACTONE) 50 MG tablet, Take 3 tablets by mouth once a day, Disp: 270 tablet, Rfl: 3   spironolactone (ALDACTONE) 50 MG tablet, Take 3 tablets (150 mg total) by mouth in the morning., Disp: 270 tablet, Rfl: 0   tretinoin (RETIN-A) 0.1 % cream, 1 application in the evening to face Externally Once at night 30 days, Disp: 45 g, Rfl: 3   valACYclovir (VALTREX) 1000 MG  tablet, Take 1 tablet (1,000 mg total) by mouth daily., Disp: 90 tablet, Rfl: 3   amphetamine-dextroamphetamine (ADDERALL) 30 MG tablet, Take 0.5 tablets by mouth 2 (two) times daily., Disp: 30 tablet, Rfl: 0   COVID-19 At Home Antigen Test (CARESTART COVID-19 HOME TEST) KIT, Use as directed within package instructions (Patient not taking: Reported on 01/05/2022), Disp: 4 kit, Rfl: 0   influenza vac split quadrivalent PF (FLUARIX) 0.5 ML injection, Inject into the muscle., Disp: 0.5 mL, Rfl: 0   Semaglutide-Weight  Management (WEGOVY) 0.25 MG/0.5ML SOAJ, Inject 0.25 mg into the skin once a week. (Patient not taking: Reported on 07/22/2022), Disp: 2 mL, Rfl: 0   Semaglutide-Weight Management (WEGOVY) 0.5 MG/0.5ML SOAJ, Inject 0.5 mg into the skin once a week. (Patient not taking: Reported on 07/22/2022), Disp: 2 mL, Rfl: 0   sertraline (ZOLOFT) 25 MG tablet, 1 po qd 5 days pre-menstrually, then stop when menses begins. (Patient not taking: Reported on 04/22/2022), Disp: 30 tablet, Rfl: 1 Medication Side Effects:  sweating  Family Medical/ Social History: Changes? No  MENTAL HEALTH EXAM:  currently breastfeeding.There is no height or weight on file to calculate BMI.  General Appearance: Casual and Well Groomed  Eye Contact:  Good  Speech:  Clear and Coherent and Normal Rate  Volume:  Normal  Mood:  Anxious  Affect:  Congruent  Thought Process:  Goal Directed and Descriptions of Associations: Circumstantial  Orientation:  Full (Time, Place, and Person)  Thought Content: Logical, Obsessions, and Rumination   Suicidal Thoughts:  No  Homicidal Thoughts:  No  Memory:  WNL  Judgement:  Good  Insight:  Good  Psychomotor Activity:  Normal  Concentration:  Concentration: Good and Attention Span: Good  Recall:  Good  Fund of Knowledge: Good  Language: Good  Assets:  Desire for Improvement  ADL's:  Intact  Cognition: WNL  Prognosis:  Good   DIAGNOSES:    ICD-10-CM   1. Skin picking habit  F42.4     2. Mixed obsessional thoughts and acts  F42.2     3. Generalized anxiety disorder  F41.1     4. Attention deficit hyperactivity disorder (ADHD), combined type  F90.2     5. PMDD (premenstrual dysphoric disorder)  F32.81       Receiving Psychotherapy: No    hasn't seen Evelena Peat Deussing in [place].  RECOMMENDATIONS:  PDMP reviewed.  Adderall filled 06/29/2022.   Ativan filled 10/20/2021.    Continue NAC 600 mg, 1 p.o. daily or twice daily. Continue Adderall 30 mg, 1/2 p.o. twice a day. Continue  Wellbutrin XL 300 mg p.o. every morning. Continue Lexapro 20 mg, 2 p.o. daily. Continue Ativan 0.5 mg, 1/2-1 3 times daily as needed severe anxiety.  Take sparingly. Continue Zoloft 25 mg, 1 p.o. daily beginning approximately 5 days before menses begins and then stop once menses begins. Continue multivitamin, fish oil, B complex, NAC, vitamin D 2000 IUs daily. Get back in with [facility]. Return in 3 months.  Donnal Moat, PA-C

## 2022-08-04 ENCOUNTER — Other Ambulatory Visit: Payer: Self-pay

## 2022-08-04 ENCOUNTER — Other Ambulatory Visit: Payer: Self-pay | Admitting: Physician Assistant

## 2022-08-04 ENCOUNTER — Ambulatory Visit
Admission: RE | Admit: 2022-08-04 | Discharge: 2022-08-04 | Disposition: A | Discharge status: Home / Self Care | Payer: 59 | Source: Ambulatory Visit | Attending: Family Medicine | Admitting: Family Medicine

## 2022-08-04 ENCOUNTER — Other Ambulatory Visit (HOSPITAL_BASED_OUTPATIENT_CLINIC_OR_DEPARTMENT_OTHER): Payer: Self-pay

## 2022-08-04 ENCOUNTER — Other Ambulatory Visit: Payer: Self-pay | Admitting: Family Medicine

## 2022-08-04 DIAGNOSIS — M545 Low back pain, unspecified: Secondary | ICD-10-CM

## 2022-08-04 DIAGNOSIS — M542 Cervicalgia: Secondary | ICD-10-CM

## 2022-08-04 DIAGNOSIS — F411 Generalized anxiety disorder: Secondary | ICD-10-CM | POA: Diagnosis not present

## 2022-08-04 DIAGNOSIS — F909 Attention-deficit hyperactivity disorder, unspecified type: Secondary | ICD-10-CM | POA: Diagnosis not present

## 2022-08-04 DIAGNOSIS — F3341 Major depressive disorder, recurrent, in partial remission: Secondary | ICD-10-CM | POA: Diagnosis not present

## 2022-08-04 DIAGNOSIS — G8929 Other chronic pain: Secondary | ICD-10-CM | POA: Diagnosis not present

## 2022-08-04 DIAGNOSIS — G5600 Carpal tunnel syndrome, unspecified upper limb: Secondary | ICD-10-CM | POA: Diagnosis not present

## 2022-08-04 MED ORDER — BUPROPION HCL ER (XL) 150 MG PO TB24
300.0000 mg | ORAL_TABLET | Freq: Every day | ORAL | 0 refills | Status: DC
  Filled 2022-08-04: qty 90, 45d supply, fill #0

## 2022-08-04 MED ORDER — CYCLOBENZAPRINE HCL 10 MG PO TABS
10.0000 mg | ORAL_TABLET | Freq: Every evening | ORAL | 0 refills | Status: AC
  Filled 2022-08-04: qty 30, 30d supply, fill #0

## 2022-08-04 MED ORDER — MELOXICAM 15 MG PO TABS
15.0000 mg | ORAL_TABLET | Freq: Every day | ORAL | 0 refills | Status: AC
  Filled 2022-08-04: qty 30, 30d supply, fill #0

## 2022-08-05 ENCOUNTER — Other Ambulatory Visit (HOSPITAL_BASED_OUTPATIENT_CLINIC_OR_DEPARTMENT_OTHER): Payer: Self-pay

## 2022-08-06 ENCOUNTER — Other Ambulatory Visit (HOSPITAL_BASED_OUTPATIENT_CLINIC_OR_DEPARTMENT_OTHER): Payer: Self-pay

## 2022-08-06 MED ORDER — SPIRONOLACTONE 50 MG PO TABS
150.0000 mg | ORAL_TABLET | Freq: Every morning | ORAL | 1 refills | Status: AC
  Filled 2022-08-06: qty 270, 90d supply, fill #0
  Filled 2023-04-22 (×2): qty 90, 30d supply, fill #1
  Filled 2023-05-29: qty 90, 30d supply, fill #2

## 2022-08-07 ENCOUNTER — Other Ambulatory Visit (HOSPITAL_BASED_OUTPATIENT_CLINIC_OR_DEPARTMENT_OTHER): Payer: Self-pay

## 2022-08-08 ENCOUNTER — Other Ambulatory Visit (HOSPITAL_BASED_OUTPATIENT_CLINIC_OR_DEPARTMENT_OTHER): Payer: Self-pay

## 2022-08-18 ENCOUNTER — Encounter: Payer: Self-pay | Admitting: Physician Assistant

## 2022-08-18 ENCOUNTER — Ambulatory Visit (INDEPENDENT_AMBULATORY_CARE_PROVIDER_SITE_OTHER): Payer: 59 | Admitting: Physician Assistant

## 2022-08-18 DIAGNOSIS — R5381 Other malaise: Secondary | ICD-10-CM

## 2022-08-18 DIAGNOSIS — F902 Attention-deficit hyperactivity disorder, combined type: Secondary | ICD-10-CM

## 2022-08-18 DIAGNOSIS — F411 Generalized anxiety disorder: Secondary | ICD-10-CM | POA: Diagnosis not present

## 2022-08-18 DIAGNOSIS — R5383 Other fatigue: Secondary | ICD-10-CM

## 2022-08-18 DIAGNOSIS — F422 Mixed obsessional thoughts and acts: Secondary | ICD-10-CM

## 2022-08-18 DIAGNOSIS — F424 Excoriation (skin-picking) disorder: Secondary | ICD-10-CM

## 2022-08-18 MED ORDER — FLUVOXAMINE MALEATE 100 MG PO TABS: 150.0000 mg | ORAL_TABLET | Freq: Every day | ORAL | 1 refills | Status: DC

## 2022-08-18 NOTE — Progress Notes (Signed)
[facility]  Patient ID: Paula Burke,  MRN: [id number]  PCP: [name], PA  Date of Evaluation: 08/18/2022 Time spent:20 minutes  Chief Complaint:  Chief Complaint   Depression; Anxiety; Follow-up    HISTORY/CURRENT STATUS: HPI For routine med check.  We changed Lexapro to Luvox a month ago. It has helped the skin picking. But she's more tired. Goes back home after she takes the kids to school and lies down on the couch, goes to sleep.  Has not very productive with work but that is not new.  It might be a little worse because she is wanting to sleep more.  Works from home.  She has stopped the Adderall for about a week because she did not feel that it is helping with energy or focus either 1.  So it has not made any difference going off of it.  Patient is able to enjoy things.  Energy and motivation are good.   No extreme sadness, tearfulness, or feelings of hopelessness.   ADLs and personal hygiene are normal.  Appetite has not changed.  Weight is stable.  Rarely has anxiety to the point that she needs the Ativan.  It is helpful when she takes it though.  Denies suicidal or homicidal thoughts.  Patient denies increased energy with decreased need for sleep, increased talkativeness, racing thoughts, impulsivity or risky behaviors, increased spending, increased libido, grandiosity, increased irritability or anger, paranoia, or hallucinations.  Denies dizziness, syncope, seizures, numbness, tingling, tremor, tics, unsteady gait, slurred speech, confusion. Denies muscle or joint pain, stiffness, or dystonia.   Individual Medical History/ Review of Systems: Changes? :No   Past medications for mental health diagnoses include: Adderall XR and IR, Wellbutrin XL (at one point she was on it alone but d/t anxiety dose was decreased and Lexapro added a few years ago) Lexapro, Zoloft caused sexual side effects, Effexor, Trintellix (didn't take long) Metadate, Vyvanse, Melatonin,  Ativan, Xanax, Trazodone   Allergies: Patient has no known allergies.  Current Medications:  Current Outpatient Medications:    Acetylcysteine 600 MG CAPS, Take 600 mg by mouth in the morning., Disp: , Rfl:    amphetamine-dextroamphetamine (ADDERALL) 30 MG tablet, Take 0.5 tablets by mouth 2 (two) times daily., Disp: 30 tablet, Rfl: 0   amphetamine-dextroamphetamine (ADDERALL) 30 MG tablet, Take 0.5 tablets by mouth 2 (two) times daily., Disp: 30 tablet, Rfl: 0   amphetamine-dextroamphetamine (ADDERALL) 30 MG tablet, Take 0.5 tablets by mouth 2 (two) times daily., Disp: 30 tablet, Rfl: 0   b complex vitamins capsule, Take 1 capsule by mouth daily., Disp: 30 capsule, Rfl:    buPROPion (WELLBUTRIN XL) 150 MG 24 hr tablet, Take 2 tablets (300 mg total) by mouth daily. (Patient taking differently: Take 150 mg by mouth daily.), Disp: 90 tablet, Rfl: 0   Cholecalciferol (VITAMIN D) 50 MCG (2000 UT) CAPS, Take 1 capsule (2,000 Units total) by mouth daily., Disp: 30 capsule, Rfl:    COLLAGEN PO, Take by mouth., Disp: , Rfl:    cyclobenzaprine (FLEXERIL) 10 MG tablet, Take 1 tablet (10 mg total) by mouth at bedtime as needed, Disp: 30 tablet, Rfl: 0   levonorgestrel (LILETTA, 52 MG,) 20.1 MCG/DAY IUD IUD, Take 1 device by intrauterine route., Disp: , Rfl:    LORazepam (ATIVAN) 0.5 MG tablet, Take 0.5-1 tablets (0.25-0.5 mg total) by mouth every 8 (eight) hours as needed for anxiety., Disp: 30 tablet, Rfl: 1   magnesium gluconate (MAGONATE) 500 MG tablet, Take 240 mg  by mouth 2 (two) times daily. 240 mg twice nightly, Disp: , Rfl:    Melatonin 2.5 MG CHEW, Chew 2.5 mg by mouth., Disp: , Rfl:    meloxicam (MOBIC) 15 MG tablet, Take 1 tablet (15 mg total) by mouth daily., Disp: 30 tablet, Rfl: 0   Multiple Vitamin (MULTI-VITAMIN) tablet, Take 1 tablet by mouth daily., Disp: , Rfl:    Multiple Vitamins-Minerals (AIRBORNE) TBEF, See admin instructions., Disp: , Rfl:    Omega-3 Fatty Acids (FISH OIL) 1000 MG  CAPS, Take by mouth., Disp: , Rfl:    spironolactone (ALDACTONE) 50 MG tablet, Take 3 tablets by mouth once a day, Disp: 270 tablet, Rfl: 3   spironolactone (ALDACTONE) 50 MG tablet, Take 3 tablets (150 mg total) by mouth in the morning., Disp: 270 tablet, Rfl: 1   tretinoin (RETIN-A) 0.1 % cream, 1 application in the evening to face Externally Once at night 30 days, Disp: 45 g, Rfl: 3   valACYclovir (VALTREX) 1000 MG tablet, Take 1 tablet (1,000 mg total) by mouth daily., Disp: 90 tablet, Rfl: 3   COVID-19 At Home Antigen Test (CARESTART COVID-19 HOME TEST) KIT, Use as directed within package instructions (Patient not taking: Reported on 01/05/2022), Disp: 4 kit, Rfl: 0   fluvoxaMINE (LUVOX) 100 MG tablet, Take 1.5 tablets (150 mg total) by mouth at bedtime., Disp: 45 tablet, Rfl: 1   influenza vac split quadrivalent PF (FLUARIX) 0.5 ML injection, Inject into the muscle. (Patient not taking: Reported on 08/18/2022), Disp: 0.5 mL, Rfl: 0   Semaglutide-Weight Management (WEGOVY) 0.25 MG/0.5ML SOAJ, Inject 0.25 mg into the skin once a week. (Patient not taking: Reported on 07/22/2022), Disp: 2 mL, Rfl: 0   Semaglutide-Weight Management (WEGOVY) 0.5 MG/0.5ML SOAJ, Inject 0.5 mg into the skin once a week. (Patient not taking: Reported on 07/22/2022), Disp: 2 mL, Rfl: 0   sertraline (ZOLOFT) 25 MG tablet, 1 po qd 5 days pre-menstrually, then stop when menses begins. (Patient not taking: Reported on 04/22/2022), Disp: 30 tablet, Rfl: 1 Medication Side Effects:  sweating  Family Medical/ Social History: Changes? No  MENTAL HEALTH EXAM:  currently breastfeeding.There is no height or weight on file to calculate BMI.  General Appearance: Casual and Well Groomed  Eye Contact:  Good  Speech:  Clear and Coherent and Normal Rate  Volume:  Normal  Mood:  Euthymic  Affect:  Congruent  Thought Process:  Goal Directed and Descriptions of Associations: Circumstantial  Orientation:  Full (Time, Place, and Person)   Thought Content: Logical, Obsessions, and Rumination   Suicidal Thoughts:  No  Homicidal Thoughts:  No  Memory:  WNL  Judgement:  Good  Insight:  Good  Psychomotor Activity:  Normal  Concentration:  Concentration: Good and Attention Span: Fair  Recall:  Good  Fund of Knowledge: Good  Language: Good  Assets:  Desire for Improvement  ADL's:  Intact  Cognition: WNL  Prognosis:  Good   DIAGNOSES:    ICD-10-CM   1. Skin picking habit  F42.4     2. Mixed obsessional thoughts and acts  F42.2     3. Generalized anxiety disorder  F41.1     4. Attention deficit hyperactivity disorder (ADHD), combined type  F90.2     5. Malaise and fatigue  R53.81    R53.83       Receiving Psychotherapy: Yes    Jessie Deussing   RECOMMENDATIONS:  PDMP reviewed.  Adderall filled 08/04/2022.  Ativan filled 10/20/2021. I provided 20 minutes  of face to face time during this encounter, including time spent before and after the visit in records review, medical decision making, counseling pertinent to today's visit, and charting.   We discussed the sleepiness.  Recommend decreasing the Luvox even though equivalent to the Lexapro dose that she was changed from a month ago the sedation is causing a problem.  Hopefully at the lower dose she will still have positive effects as far as the skin picking goes. Will consider increasing Adderall at the next visit if she is still having problems with focus.  I do recommend she stay on it for now though.  Continue Adderall 30 mg, 1/2 p.o. twice a day. Continue Wellbutrin XL 150 mg p.o. every morning. Decrease Luvox 100 mg, to 1.5 pills nightly.  Take earlier in the evening. Continue Ativan 0.5 mg, 1/2-1 3 times daily as needed severe anxiety.  Take sparingly. Continue multivitamin, fish oil, B complex, NAC, vitamin D 2000 IUs daily. Continue therapy with Evelena Peat Deussing. Return in 4-6 wks.  Donnal Moat, PA-C

## 2022-08-19 DIAGNOSIS — M545 Low back pain, unspecified: Secondary | ICD-10-CM | POA: Diagnosis not present

## 2022-08-19 DIAGNOSIS — M542 Cervicalgia: Secondary | ICD-10-CM | POA: Diagnosis not present

## 2022-08-22 ENCOUNTER — Other Ambulatory Visit (HOSPITAL_BASED_OUTPATIENT_CLINIC_OR_DEPARTMENT_OTHER): Payer: Self-pay

## 2022-08-22 DIAGNOSIS — M542 Cervicalgia: Secondary | ICD-10-CM | POA: Diagnosis not present

## 2022-08-22 DIAGNOSIS — M545 Low back pain, unspecified: Secondary | ICD-10-CM | POA: Diagnosis not present

## 2022-08-25 DIAGNOSIS — M542 Cervicalgia: Secondary | ICD-10-CM | POA: Diagnosis not present

## 2022-08-25 DIAGNOSIS — M545 Low back pain, unspecified: Secondary | ICD-10-CM | POA: Diagnosis not present

## 2022-09-01 ENCOUNTER — Other Ambulatory Visit (HOSPITAL_BASED_OUTPATIENT_CLINIC_OR_DEPARTMENT_OTHER): Payer: Self-pay

## 2022-09-01 DIAGNOSIS — L281 Prurigo nodularis: Secondary | ICD-10-CM | POA: Diagnosis not present

## 2022-09-01 DIAGNOSIS — F424 Excoriation (skin-picking) disorder: Secondary | ICD-10-CM | POA: Diagnosis not present

## 2022-09-01 DIAGNOSIS — L700 Acne vulgaris: Secondary | ICD-10-CM | POA: Diagnosis not present

## 2022-09-01 MED ORDER — SPIRONOLACTONE 50 MG PO TABS
150.0000 mg | ORAL_TABLET | Freq: Every day | ORAL | 3 refills | Status: AC
  Filled 2022-09-01: qty 270, 90d supply, fill #0
  Filled 2023-03-02: qty 90, 30d supply, fill #0

## 2022-09-01 MED ORDER — TRETINOIN 0.1 % EX CREA
TOPICAL_CREAM | CUTANEOUS | 3 refills | Status: AC
  Filled 2022-09-01 – 2022-12-12 (×2): qty 45, 30d supply, fill #0

## 2022-09-16 ENCOUNTER — Ambulatory Visit: Payer: 59 | Admitting: Physician Assistant

## 2022-09-22 ENCOUNTER — Ambulatory Visit (HOSPITAL_BASED_OUTPATIENT_CLINIC_OR_DEPARTMENT_OTHER): Payer: 59 | Admitting: Physical Therapy

## 2022-09-24 DIAGNOSIS — M542 Cervicalgia: Secondary | ICD-10-CM | POA: Diagnosis not present

## 2022-09-24 DIAGNOSIS — M545 Low back pain, unspecified: Secondary | ICD-10-CM | POA: Diagnosis not present

## 2022-10-03 ENCOUNTER — Other Ambulatory Visit (HOSPITAL_BASED_OUTPATIENT_CLINIC_OR_DEPARTMENT_OTHER): Payer: Self-pay

## 2022-10-05 ENCOUNTER — Other Ambulatory Visit: Payer: Self-pay | Admitting: Physician Assistant

## 2022-10-06 ENCOUNTER — Other Ambulatory Visit (HOSPITAL_BASED_OUTPATIENT_CLINIC_OR_DEPARTMENT_OTHER): Payer: Self-pay

## 2022-10-06 ENCOUNTER — Other Ambulatory Visit: Payer: Self-pay | Admitting: Physician Assistant

## 2022-10-06 DIAGNOSIS — G5603 Carpal tunnel syndrome, bilateral upper limbs: Secondary | ICD-10-CM | POA: Diagnosis not present

## 2022-10-07 ENCOUNTER — Other Ambulatory Visit (HOSPITAL_BASED_OUTPATIENT_CLINIC_OR_DEPARTMENT_OTHER): Payer: Self-pay

## 2022-10-07 MED ORDER — FLUVOXAMINE MALEATE 100 MG PO TABS
150.0000 mg | ORAL_TABLET | Freq: Every day | ORAL | 1 refills | Status: DC
  Filled 2022-10-07: qty 45, 30d supply, fill #0
  Filled 2022-12-12: qty 45, 30d supply, fill #1

## 2022-10-17 ENCOUNTER — Encounter: Payer: Self-pay | Admitting: Physician Assistant

## 2022-10-17 ENCOUNTER — Ambulatory Visit (INDEPENDENT_AMBULATORY_CARE_PROVIDER_SITE_OTHER): Payer: 59 | Admitting: Physician Assistant

## 2022-10-17 DIAGNOSIS — F422 Mixed obsessional thoughts and acts: Secondary | ICD-10-CM | POA: Diagnosis not present

## 2022-10-17 DIAGNOSIS — F411 Generalized anxiety disorder: Secondary | ICD-10-CM

## 2022-10-17 DIAGNOSIS — F424 Excoriation (skin-picking) disorder: Secondary | ICD-10-CM | POA: Diagnosis not present

## 2022-10-17 DIAGNOSIS — F902 Attention-deficit hyperactivity disorder, combined type: Secondary | ICD-10-CM | POA: Diagnosis not present

## 2022-10-17 NOTE — Patient Instructions (Signed)
Wean off the Luvox 100 mg, take 1 pill every night for 1 week, then 1/2 pill every night for 1 week, then stop.

## 2022-10-17 NOTE — Progress Notes (Signed)
[facility]  Patient ID: Paula Burke,  MRN: [id number]  PCP: [name], PA  Date of Evaluation: 10/17/2022 Time spent:20 minutes  Chief Complaint:  Chief Complaint   Anxiety; Depression; Follow-up    HISTORY/CURRENT STATUS: HPI For routine med check.  We decreased the Luvox 2 months ago, hoping to help decrease fatigue in the mornings. It hasn't made much of a difference, she feels like it's related to sleep hygiene, her circadian rhythm is off. She's seen a functional medicine provider recently who told her she had very low testosterone. She stopped the Spironolactone b/c of that. Has seen a wellness coordinator once and will start seeing them every 2 weeks which she thinks will help. Skin picking is better. Not completely gone but better. Does obsess about things, gets in her head and can't get thoughts out.  No panic attacks.  Able to work, but does the minimal necessary.  States she would like to go off of her medications if that would be appropriate and start over.  Hard time enjoying things. Feels numb, doesn't cry easily. Takes the kids to school, then goes and lies down on the couch. Not new. Going on for years, see old PN.  No extreme sadness, tearfulness, or feelings of hopelessness.  ADLs and personal hygiene are normal.   Denies any changes in concentration, making decisions, or remembering things.  She is not taking the Adderall, difficult finding it.  Appetite has not changed.  Weight is stable.  Denies suicidal or homicidal thoughts.  Patient denies increased energy with decreased need for sleep, increased talkativeness, racing thoughts, impulsivity or risky behaviors, increased spending, increased libido, grandiosity, increased irritability or anger, paranoia, or hallucinations.  Denies dizziness, syncope, seizures, numbness, tingling, tremor, tics, unsteady gait, slurred speech, confusion. Denies muscle or joint pain, stiffness, or dystonia.   Individual  Medical History/ Review of Systems: Changes? :No   Past medications for mental health diagnoses include: Adderall XR and IR, Wellbutrin XL (at one point she was on it alone but d/t anxiety dose was decreased and Lexapro added a few years ago) Lexapro, Zoloft caused sexual side effects, Effexor, Trintellix (didn't take long) Metadate, Vyvanse, Melatonin, Ativan, Xanax, Trazodone   Allergies: Patient has no known allergies.  Current Medications:  Current Outpatient Medications:    Acetylcysteine 600 MG CAPS, Take 600 mg by mouth in the morning., Disp: , Rfl:    buPROPion (WELLBUTRIN XL) 150 MG 24 hr tablet, Take 2 tablets (300 mg total) by mouth daily. (Patient taking differently: Take 150 mg by mouth daily.), Disp: 90 tablet, Rfl: 0   Cholecalciferol (VITAMIN D) 50 MCG (2000 UT) CAPS, Take 1 capsule (2,000 Units total) by mouth daily., Disp: 30 capsule, Rfl:    fluvoxaMINE (LUVOX) 100 MG tablet, Take 1.5 tablets (150 mg total) by mouth at bedtime., Disp: 45 tablet, Rfl: 1   fluvoxaMINE (LUVOX) 100 MG tablet, Take 1.5 tablets (150 mg total) by mouth at bedtime., Disp: 45 tablet, Rfl: 1   levonorgestrel (LILETTA, 52 MG,) 20.1 MCG/DAY IUD IUD, Take 1 device by intrauterine route., Disp: , Rfl:    LORazepam (ATIVAN) 0.5 MG tablet, Take 0.5-1 tablets (0.25-0.5 mg total) by mouth every 8 (eight) hours as needed for anxiety., Disp: 30 tablet, Rfl: 1   magnesium gluconate (MAGONATE) 500 MG tablet, Take 240 mg by mouth 2 (two) times daily. 240 mg twice nightly, Disp: , Rfl:    Melatonin 2.5 MG CHEW, Chew 2.5 mg by mouth., Disp: ,  Rfl:    meloxicam (MOBIC) 15 MG tablet, Take 1 tablet (15 mg total) by mouth daily., Disp: 30 tablet, Rfl: 0   Multiple Vitamin (MULTI-VITAMIN) tablet, Take 1 tablet by mouth daily., Disp: , Rfl:    Multiple Vitamins-Minerals (AIRBORNE) TBEF, See admin instructions., Disp: , Rfl:    Omega-3 Fatty Acids (FISH OIL) 1000 MG CAPS, Take by mouth., Disp: , Rfl:    sertraline (ZOLOFT)  25 MG tablet, 1 po qd 5 days pre-menstrually, then stop when menses begins., Disp: 30 tablet, Rfl: 1   tretinoin (RETIN-A) 0.1 % cream, 1 application in the evening to face Externally Once at night 30 days, Disp: 45 g, Rfl: 3   valACYclovir (VALTREX) 1000 MG tablet, Take 1 tablet (1,000 mg total) by mouth daily., Disp: 90 tablet, Rfl: 3   amphetamine-dextroamphetamine (ADDERALL) 30 MG tablet, Take 0.5 tablets by mouth 2 (two) times daily. (Patient not taking: Reported on 10/17/2022), Disp: 30 tablet, Rfl: 0   amphetamine-dextroamphetamine (ADDERALL) 30 MG tablet, Take 0.5 tablets by mouth 2 (two) times daily. (Patient not taking: Reported on 10/17/2022), Disp: 30 tablet, Rfl: 0   amphetamine-dextroamphetamine (ADDERALL) 30 MG tablet, Take 0.5 tablets by mouth 2 (two) times daily. (Patient not taking: Reported on 10/17/2022), Disp: 30 tablet, Rfl: 0   b complex vitamins capsule, Take 1 capsule by mouth daily. (Patient not taking: Reported on 10/17/2022), Disp: 30 capsule, Rfl:    COLLAGEN PO, Take by mouth. (Patient not taking: Reported on 10/17/2022), Disp: , Rfl:    COVID-19 At Home Antigen Test (CARESTART COVID-19 HOME TEST) KIT, Use as directed within package instructions (Patient not taking: Reported on 01/05/2022), Disp: 4 kit, Rfl: 0   cyclobenzaprine (FLEXERIL) 10 MG tablet, Take 1 tablet (10 mg total) by mouth at bedtime as needed (Patient not taking: Reported on 10/17/2022), Disp: 30 tablet, Rfl: 0   influenza vac split quadrivalent PF (FLUARIX) 0.5 ML injection, Inject into the muscle. (Patient not taking: Reported on 08/18/2022), Disp: 0.5 mL, Rfl: 0   Semaglutide-Weight Management (WEGOVY) 0.25 MG/0.5ML SOAJ, Inject 0.25 mg into the skin once a week. (Patient not taking: Reported on 07/22/2022), Disp: 2 mL, Rfl: 0   Semaglutide-Weight Management (WEGOVY) 0.5 MG/0.5ML SOAJ, Inject 0.5 mg into the skin once a week. (Patient not taking: Reported on 07/22/2022), Disp: 2 mL, Rfl: 0   spironolactone (ALDACTONE) 50  MG tablet, Take 3 tablets by mouth once a day (Patient not taking: Reported on 10/17/2022), Disp: 270 tablet, Rfl: 3   spironolactone (ALDACTONE) 50 MG tablet, Take 3 tablets (150 mg total) by mouth in the morning. (Patient not taking: Reported on 10/17/2022), Disp: 270 tablet, Rfl: 1   spironolactone (ALDACTONE) 50 MG tablet, Take 3 tablets (150 mg total) by mouth daily. (Patient not taking: Reported on 10/17/2022), Disp: 270 tablet, Rfl: 3 Medication Side Effects:  sweating  Family Medical/ Social History: Changes? No  MENTAL HEALTH EXAM:  currently breastfeeding.There is no height or weight on file to calculate BMI.  General Appearance: Casual and Well Groomed  Eye Contact:  Good  Speech:  Clear and Coherent and Normal Rate  Volume:  Normal  Mood:  Euthymic  Affect:  Congruent  Thought Process:  Goal Directed and Descriptions of Associations: Circumstantial  Orientation:  Full (Time, Place, and Person)  Thought Content: Logical, Obsessions, and Rumination   Suicidal Thoughts:  No  Homicidal Thoughts:  No  Memory:  WNL  Judgement:  Good  Insight:  Good  Psychomotor Activity:  Normal  Concentration:  Concentration: Fair and Attention Span: Fair  Recall:  Good  Fund of Knowledge: Good  Language: Good  Assets:  Desire for Improvement  ADL's:  Intact  Cognition: WNL  Prognosis:  Good   DIAGNOSES:    ICD-10-CM   1. Mixed obsessional thoughts and acts  F42.2     2. Generalized anxiety disorder  F41.1     3. Skin picking habit  F42.4     4. Attention deficit hyperactivity disorder (ADHD), combined type  F90.2       Receiving Psychotherapy: No    Evelena Peat Deussing in the past.  RECOMMENDATIONS:  PDMP reviewed.  Adderall filled 08/04/2022.  Ativan filled 10/20/2021. I provided 20 minutes of face to face time during this encounter, including time spent before and after the visit in records review, medical decision making, counseling pertinent to today's visit, and charting.   Since  the Luvox does not seem to be helping and may be the cause of the sweating and hot flashes we decided to wean off of that.  She understands that the anxiety and/or obsessive thoughts and skin picking may worsen going off of it.  Wean off Luvox 100 mg by taking 1 nightly for 1 week then 1/2 pill nightly for 1 week and then stop. Continue Wellbutrin XL 150 mg p.o. every morning. Continue Ativan 0.5 mg, 1/2-1 3 times daily as needed severe anxiety.   Continue multivitamin, fish oil, B complex, NAC, vitamin D 2000 IUs daily. Get back in counseling. Return in 4 weeks.  Donnal Moat, PA-C

## 2022-11-07 ENCOUNTER — Other Ambulatory Visit (HOSPITAL_BASED_OUTPATIENT_CLINIC_OR_DEPARTMENT_OTHER): Payer: Self-pay

## 2022-11-07 MED ORDER — PHENTERMINE HCL 37.5 MG PO TABS
ORAL_TABLET | ORAL | 0 refills | Status: AC
  Filled 2022-11-07 – 2022-11-08 (×2): qty 30, 30d supply, fill #0

## 2022-11-08 ENCOUNTER — Other Ambulatory Visit (HOSPITAL_BASED_OUTPATIENT_CLINIC_OR_DEPARTMENT_OTHER): Payer: Self-pay

## 2022-11-18 ENCOUNTER — Ambulatory Visit (INDEPENDENT_AMBULATORY_CARE_PROVIDER_SITE_OTHER): Payer: 59 | Admitting: Physician Assistant

## 2022-11-18 DIAGNOSIS — Z1231 Encounter for screening mammogram for malignant neoplasm of breast: Secondary | ICD-10-CM | POA: Diagnosis not present

## 2022-11-22 ENCOUNTER — Other Ambulatory Visit (HOSPITAL_BASED_OUTPATIENT_CLINIC_OR_DEPARTMENT_OTHER): Payer: Self-pay

## 2022-11-22 DIAGNOSIS — R928 Other abnormal and inconclusive findings on diagnostic imaging of breast: Secondary | ICD-10-CM | POA: Diagnosis not present

## 2022-11-22 MED ORDER — TOPIRAMATE 25 MG PO TABS
25.0000 mg | ORAL_TABLET | Freq: Every day | ORAL | 0 refills | Status: DC
  Filled 2022-11-22: qty 30, 30d supply, fill #0

## 2022-11-28 DIAGNOSIS — H524 Presbyopia: Secondary | ICD-10-CM | POA: Diagnosis not present

## 2022-11-28 DIAGNOSIS — H5203 Hypermetropia, bilateral: Secondary | ICD-10-CM | POA: Diagnosis not present

## 2022-11-28 DIAGNOSIS — H52223 Regular astigmatism, bilateral: Secondary | ICD-10-CM | POA: Diagnosis not present

## 2022-12-06 ENCOUNTER — Other Ambulatory Visit (HOSPITAL_BASED_OUTPATIENT_CLINIC_OR_DEPARTMENT_OTHER): Payer: Self-pay

## 2022-12-06 DIAGNOSIS — Z9189 Other specified personal risk factors, not elsewhere classified: Secondary | ICD-10-CM | POA: Diagnosis not present

## 2022-12-06 DIAGNOSIS — E669 Obesity, unspecified: Secondary | ICD-10-CM | POA: Diagnosis not present

## 2022-12-06 DIAGNOSIS — F411 Generalized anxiety disorder: Secondary | ICD-10-CM | POA: Diagnosis not present

## 2022-12-06 DIAGNOSIS — Z6834 Body mass index (BMI) 34.0-34.9, adult: Secondary | ICD-10-CM | POA: Diagnosis not present

## 2022-12-06 DIAGNOSIS — G4733 Obstructive sleep apnea (adult) (pediatric): Secondary | ICD-10-CM | POA: Diagnosis not present

## 2022-12-06 MED ORDER — PHENTERMINE HCL 37.5 MG PO TABS
18.7500 mg | ORAL_TABLET | Freq: Every day | ORAL | 0 refills | Status: DC
  Filled 2022-12-06 – 2022-12-15 (×2): qty 30, 30d supply, fill #0

## 2022-12-06 MED ORDER — TOPIRAMATE 25 MG PO TABS
25.0000 mg | ORAL_TABLET | Freq: Every day | ORAL | 1 refills | Status: DC
  Filled 2022-12-06 – 2022-12-16 (×3): qty 90, 90d supply, fill #0
  Filled 2023-03-19: qty 30, 30d supply, fill #1
  Filled 2023-04-23: qty 30, 30d supply, fill #2
  Filled 2023-05-28: qty 30, 30d supply, fill #3

## 2022-12-07 ENCOUNTER — Other Ambulatory Visit (HOSPITAL_BASED_OUTPATIENT_CLINIC_OR_DEPARTMENT_OTHER): Payer: Self-pay

## 2022-12-08 ENCOUNTER — Other Ambulatory Visit (HOSPITAL_BASED_OUTPATIENT_CLINIC_OR_DEPARTMENT_OTHER): Payer: Self-pay

## 2022-12-12 ENCOUNTER — Other Ambulatory Visit (HOSPITAL_BASED_OUTPATIENT_CLINIC_OR_DEPARTMENT_OTHER): Payer: Self-pay

## 2022-12-14 ENCOUNTER — Other Ambulatory Visit (HOSPITAL_BASED_OUTPATIENT_CLINIC_OR_DEPARTMENT_OTHER): Payer: Self-pay

## 2022-12-14 DIAGNOSIS — F341 Dysthymic disorder: Secondary | ICD-10-CM | POA: Diagnosis not present

## 2022-12-14 DIAGNOSIS — F429 Obsessive-compulsive disorder, unspecified: Secondary | ICD-10-CM | POA: Diagnosis not present

## 2022-12-14 DIAGNOSIS — F909 Attention-deficit hyperactivity disorder, unspecified type: Secondary | ICD-10-CM | POA: Diagnosis not present

## 2022-12-15 ENCOUNTER — Other Ambulatory Visit (HOSPITAL_BASED_OUTPATIENT_CLINIC_OR_DEPARTMENT_OTHER): Payer: Self-pay

## 2022-12-16 ENCOUNTER — Other Ambulatory Visit (HOSPITAL_BASED_OUTPATIENT_CLINIC_OR_DEPARTMENT_OTHER): Payer: Self-pay

## 2022-12-16 ENCOUNTER — Other Ambulatory Visit: Payer: Self-pay

## 2022-12-16 ENCOUNTER — Ambulatory Visit (INDEPENDENT_AMBULATORY_CARE_PROVIDER_SITE_OTHER): Payer: 59 | Admitting: Physician Assistant

## 2022-12-16 ENCOUNTER — Encounter: Payer: Self-pay | Admitting: Physician Assistant

## 2022-12-16 DIAGNOSIS — F331 Major depressive disorder, recurrent, moderate: Secondary | ICD-10-CM | POA: Diagnosis not present

## 2022-12-16 DIAGNOSIS — F422 Mixed obsessional thoughts and acts: Secondary | ICD-10-CM

## 2022-12-16 MED ORDER — SERTRALINE HCL 50 MG PO TABS
50.0000 mg | ORAL_TABLET | Freq: Every day | ORAL | 1 refills | Status: AC
  Filled 2022-12-16: qty 30, 30d supply, fill #0

## 2022-12-16 NOTE — Progress Notes (Signed)
[facility]  Patient ID: Paula Burke,  MRN: [id number]  PCP: Wilfrid Lund, PA  Date of Evaluation: 12/16/2022 Time spent:20 minutes  Chief Complaint:  Chief Complaint   Anxiety; Depression; Follow-up    HISTORY/CURRENT STATUS: HPI For routine med check.  Has ruminating thoughts, can't get things out of her head. Example is she can't stop thinking about getting her kids off their screens. Thoughts are like on a conveyor belt, and they're not big things, but they keep going on and on and on. She can't remember if the thoughts were better when she was on Luvox or any other meds, but she thinks she didn't spiral as much as she is now. Thoughts are worse at night. Dreads the evening.  Stress makes it worse.  Has always struggled with ruminating thoughts.  When she took Luvox she was tired a lot, sleeping during the day.  But she is not sure if it was the drug or from being depressed.  She still feels tired a lot of the time.  She also had decreased libido on the Luvox but states sex is not an issue right now, she just wants to feel better.  She is not taking the Ativan.  She went off Wellbutrin. She contacted her PCP for advice on how to safely get off it.  States it wasn't helping and might have been making the anxiety and ruminating thoughts worse.  She has a hard time enjoying things.  Energy and motivation are low.  Work is about the same.  She is sad a lot, not always triggered.  ADLs and personal hygiene are normal.   Denies any changes in concentration, making decisions, or remembering things.  Appetite has not changed.  Weight is stable.  Is now on Phentermine.  Denies cutting or any form of self-harm.  Not picking at her skin like she has in the past.  Denies suicidal or homicidal thoughts.  Patient denies increased energy with decreased need for sleep, increased talkativeness, racing thoughts, impulsivity or risky behaviors, increased spending, increased libido,  grandiosity, increased irritability or anger, paranoia, or hallucinations.  Denies dizziness, syncope, seizures, numbness, tingling, tremor, tics, unsteady gait, slurred speech, confusion. Denies muscle or joint pain, stiffness, or dystonia.   Individual Medical History/ Review of Systems: Changes? :No   Past medications for mental health diagnoses include: Adderall XR and IR, Wellbutrin XL (at one point she was on it alone but d/t anxiety dose was decreased and Lexapro added a few years ago) Zoloft caused sexual side effects, Effexor, Trintellix (didn't take long) Metadate, Vyvanse, Melatonin, Ativan, Xanax, Trazodone   Allergies: Patient has no known allergies.  Current Medications:  Current Outpatient Medications:    Acetylcysteine 600 MG CAPS, Take 600 mg by mouth in the morning., Disp: , Rfl:    b complex vitamins capsule, Take 1 capsule by mouth daily., Disp: 30 capsule, Rfl:    levonorgestrel (LILETTA, 52 MG,) 20.1 MCG/DAY IUD IUD, Take 1 device by intrauterine route., Disp: , Rfl:    magnesium gluconate (MAGONATE) 500 MG tablet, Take 240 mg by mouth 2 (two) times daily. 240 mg twice nightly, Disp: , Rfl:    Melatonin 2.5 MG CHEW, Chew 2.5 mg by mouth., Disp: , Rfl:    meloxicam (MOBIC) 15 MG tablet, Take 1 tablet (15 mg total) by mouth daily., Disp: 30 tablet, Rfl: 0   Multiple Vitamin (MULTI-VITAMIN) tablet, Take 1 tablet by mouth daily., Disp: , Rfl:    Multiple Vitamins-Minerals (  AIRBORNE) TBEF, See admin instructions., Disp: , Rfl:    Omega-3 Fatty Acids (FISH OIL) 1000 MG CAPS, Take by mouth., Disp: , Rfl:    phentermine (ADIPEX-P) 37.5 MG tablet, Take 1/2-1 tablet by mouth daily before breakfast, Disp: 30 tablet, Rfl: 0   phentermine (ADIPEX-P) 37.5 MG tablet, Take 0.5-1 tablets (18.75-37.5 mg total) by mouth daily before breakfast., Disp: 30 tablet, Rfl: 0   sertraline (ZOLOFT) 50 MG tablet, Take 1 tablet (50 mg total) by mouth daily., Disp: 30 tablet, Rfl: 1   topiramate  (TOPAMAX) 25 MG tablet, Take 1 tablet (25 mg total) by mouth daily., Disp: 90 tablet, Rfl: 1   tretinoin (RETIN-A) 0.1 % cream, Apply once daily in the evening to face, Disp: 45 g, Rfl: 3   valACYclovir (VALTREX) 1000 MG tablet, Take 1 tablet (1,000 mg total) by mouth daily., Disp: 90 tablet, Rfl: 3   Cholecalciferol (VITAMIN D) 50 MCG (2000 UT) CAPS, Take 1 capsule (2,000 Units total) by mouth daily. (Patient not taking: Reported on 12/16/2022), Disp: 30 capsule, Rfl:    COLLAGEN PO, Take by mouth. (Patient not taking: Reported on 10/17/2022), Disp: , Rfl:    COVID-19 At Home Antigen Test (CARESTART COVID-19 HOME TEST) KIT, Use as directed within package instructions (Patient not taking: Reported on 01/05/2022), Disp: 4 kit, Rfl: 0   cyclobenzaprine (FLEXERIL) 10 MG tablet, Take 1 tablet (10 mg total) by mouth at bedtime as needed (Patient not taking: Reported on 10/17/2022), Disp: 30 tablet, Rfl: 0   influenza vac split quadrivalent PF (FLUARIX) 0.5 ML injection, Inject into the muscle. (Patient not taking: Reported on 08/18/2022), Disp: 0.5 mL, Rfl: 0   LORazepam (ATIVAN) 0.5 MG tablet, Take 0.5-1 tablets (0.25-0.5 mg total) by mouth every 8 (eight) hours as needed for anxiety. (Patient not taking: Reported on 12/16/2022), Disp: 30 tablet, Rfl: 1   Semaglutide-Weight Management (WEGOVY) 0.25 MG/0.5ML SOAJ, Inject 0.25 mg into the skin once a week. (Patient not taking: Reported on 07/22/2022), Disp: 2 mL, Rfl: 0   Semaglutide-Weight Management (WEGOVY) 0.5 MG/0.5ML SOAJ, Inject 0.5 mg into the skin once a week. (Patient not taking: Reported on 07/22/2022), Disp: 2 mL, Rfl: 0   spironolactone (ALDACTONE) 50 MG tablet, Take 3 tablets by mouth once a day (Patient not taking: Reported on 10/17/2022), Disp: 270 tablet, Rfl: 3   spironolactone (ALDACTONE) 50 MG tablet, Take 3 tablets (150 mg total) by mouth in the morning. (Patient not taking: Reported on 10/17/2022), Disp: 270 tablet, Rfl: 1   spironolactone (ALDACTONE) 50  MG tablet, Take 3 tablets (150 mg total) by mouth daily. (Patient not taking: Reported on 10/17/2022), Disp: 270 tablet, Rfl: 3 Medication Side Effects:  sweating  Family Medical/ Social History: Changes? No  MENTAL HEALTH EXAM:  currently breastfeeding.There is no height or weight on file to calculate BMI.  General Appearance: Casual and Well Groomed  Eye Contact:  Minimal  Speech:  Clear and Coherent and Normal Rate  Volume:  Normal  Mood:   sad  Affect:  Congruent  Thought Process:  Goal Directed and Descriptions of Associations: Circumstantial  Orientation:  Full (Time, Place, and Person)  Thought Content: Logical, Obsessions, and Rumination   Suicidal Thoughts:  No  Homicidal Thoughts:  No  Memory:  WNL  Judgement:  Good  Insight:  Good  Psychomotor Activity:  Normal  Concentration:  Concentration: Fair and Attention Span: Fair  Recall:  Good  Fund of Knowledge: Good  Language: Good  Assets:  Communication  Skills Desire for Improvement Financial Resources/Insurance Housing Transportation Vocational/Educational  ADL's:  Intact  Cognition: WNL  Prognosis:  Good   DIAGNOSES:    ICD-10-CM   1. Major depressive disorder, recurrent episode, moderate (HCC)  F33.1     2. Mixed obsessional thoughts and acts  F42.2      Receiving Psychotherapy: Yes    has seen Mylinda Latina once now   RECOMMENDATIONS:  PDMP reviewed.  Adderall filled 08/04/2022.  Ativan filled 10/20/2021.  Phentermine filled 12/15/2022. I provided 20 minutes of face to face time during this encounter, including time spent before and after the visit in records review, medical decision making, counseling pertinent to today's visit, and charting.   We discussed the depression and anxiety/OCD. I recommend either re-starting the Luvox or I would prefer adding Zoloft. It doesn't tend to cause drowsiness like the Luvox can. It can be beneficial for anxiety and OCD as well as depression.  Another option would be  adding clomipramine.  It can also cause drowsiness and sometimes more weight gain than Luvox or Zoloft which is not a good side effect.  Therefore clomipramine would not be my first choice.  Benefits, risks, and side effects of the Zoloft were discussed and she accepts.  We discussed the Wellbutrin.  I agree that it can cause, or worsen, anxiety. It would have been best if she called to let me, as the prescriber, know she wanted to stop it, so I could make a change by phone if clinically appropriate.  She said she contacted her PCP instead of me because she could send them a note via the portal, yet she did not try to reach me.  In the future, I asked her to contact me if there needs to be a change in any of her mental health medications that I prescribe.  The most important thing now is that she got off the Wellbutrin safely.  Start Zoloft 50 mg, 1 p.o. daily. Continue multivitamin, fish oil, B complex, NAC, vitamin D 2000 IUs daily. Continue counseling with Mylinda Latina. Return in 4 weeks.  Melony Overly, PA-C

## 2023-01-11 ENCOUNTER — Other Ambulatory Visit (HOSPITAL_BASED_OUTPATIENT_CLINIC_OR_DEPARTMENT_OTHER): Payer: Self-pay

## 2023-01-11 MED ORDER — PHENTERMINE HCL 37.5 MG PO TABS
18.7500 mg | ORAL_TABLET | Freq: Every day | ORAL | 0 refills | Status: DC
  Filled 2023-01-12: qty 30, 30d supply, fill #0

## 2023-01-12 ENCOUNTER — Other Ambulatory Visit (HOSPITAL_BASED_OUTPATIENT_CLINIC_OR_DEPARTMENT_OTHER): Payer: Self-pay

## 2023-02-07 ENCOUNTER — Other Ambulatory Visit (HOSPITAL_BASED_OUTPATIENT_CLINIC_OR_DEPARTMENT_OTHER): Payer: Self-pay

## 2023-02-07 MED ORDER — PHENTERMINE HCL 37.5 MG PO TABS
ORAL_TABLET | ORAL | 0 refills | Status: DC
  Filled 2023-02-07: qty 30, 30d supply, fill #0

## 2023-02-07 MED ORDER — PEG 3350-KCL-NA BICARB-NACL 420 G PO SOLR
ORAL | 0 refills | Status: AC
  Filled 2023-02-07: qty 4000, 1d supply, fill #0

## 2023-02-22 ENCOUNTER — Other Ambulatory Visit (HOSPITAL_BASED_OUTPATIENT_CLINIC_OR_DEPARTMENT_OTHER): Payer: Self-pay

## 2023-02-22 MED ORDER — DULOXETINE HCL 30 MG PO CPEP
30.0000 mg | ORAL_CAPSULE | Freq: Every day | ORAL | 1 refills | Status: DC
  Filled 2023-02-22: qty 30, 30d supply, fill #0
  Filled 2023-03-19: qty 30, 30d supply, fill #1

## 2023-03-03 ENCOUNTER — Other Ambulatory Visit (HOSPITAL_BASED_OUTPATIENT_CLINIC_OR_DEPARTMENT_OTHER): Payer: Self-pay

## 2023-03-03 ENCOUNTER — Other Ambulatory Visit: Payer: Self-pay

## 2023-03-19 ENCOUNTER — Other Ambulatory Visit (HOSPITAL_BASED_OUTPATIENT_CLINIC_OR_DEPARTMENT_OTHER): Payer: Self-pay

## 2023-03-20 ENCOUNTER — Other Ambulatory Visit (HOSPITAL_BASED_OUTPATIENT_CLINIC_OR_DEPARTMENT_OTHER): Payer: Self-pay

## 2023-03-21 ENCOUNTER — Other Ambulatory Visit: Payer: Self-pay

## 2023-03-21 ENCOUNTER — Other Ambulatory Visit (HOSPITAL_BASED_OUTPATIENT_CLINIC_OR_DEPARTMENT_OTHER): Payer: Self-pay

## 2023-03-21 MED ORDER — VALACYCLOVIR HCL 1 G PO TABS
1000.0000 mg | ORAL_TABLET | Freq: Every day | ORAL | 0 refills | Status: AC
  Filled 2023-03-21: qty 30, 30d supply, fill #0
  Filled 2023-04-25: qty 30, 30d supply, fill #1

## 2023-04-18 ENCOUNTER — Other Ambulatory Visit (HOSPITAL_BASED_OUTPATIENT_CLINIC_OR_DEPARTMENT_OTHER): Payer: Self-pay

## 2023-04-18 MED ORDER — DULOXETINE HCL 30 MG PO CPEP
30.0000 mg | ORAL_CAPSULE | Freq: Every day | ORAL | 1 refills | Status: AC
  Filled 2023-04-18 – 2023-04-22 (×2): qty 30, 30d supply, fill #0
  Filled 2023-05-18: qty 30, 30d supply, fill #1
  Filled 2023-05-19 (×2): qty 30, 30d supply, fill #0

## 2023-04-19 ENCOUNTER — Other Ambulatory Visit (HOSPITAL_BASED_OUTPATIENT_CLINIC_OR_DEPARTMENT_OTHER): Payer: Self-pay

## 2023-04-22 ENCOUNTER — Other Ambulatory Visit (HOSPITAL_BASED_OUTPATIENT_CLINIC_OR_DEPARTMENT_OTHER): Payer: Self-pay

## 2023-04-22 ENCOUNTER — Other Ambulatory Visit (HOSPITAL_COMMUNITY): Payer: Self-pay

## 2023-04-27 ENCOUNTER — Other Ambulatory Visit (HOSPITAL_BASED_OUTPATIENT_CLINIC_OR_DEPARTMENT_OTHER): Payer: Self-pay

## 2023-04-28 ENCOUNTER — Other Ambulatory Visit (HOSPITAL_BASED_OUTPATIENT_CLINIC_OR_DEPARTMENT_OTHER): Payer: Self-pay

## 2023-05-16 ENCOUNTER — Other Ambulatory Visit (HOSPITAL_BASED_OUTPATIENT_CLINIC_OR_DEPARTMENT_OTHER): Payer: Self-pay

## 2023-05-17 ENCOUNTER — Other Ambulatory Visit (HOSPITAL_BASED_OUTPATIENT_CLINIC_OR_DEPARTMENT_OTHER): Payer: Self-pay

## 2023-05-17 MED ORDER — PHENTERMINE HCL 37.5 MG PO TABS
18.7500 mg | ORAL_TABLET | Freq: Every day | ORAL | 0 refills | Status: AC
  Filled 2023-05-17: qty 30, 30d supply, fill #0

## 2023-05-18 ENCOUNTER — Other Ambulatory Visit (HOSPITAL_BASED_OUTPATIENT_CLINIC_OR_DEPARTMENT_OTHER): Payer: Self-pay

## 2023-05-19 ENCOUNTER — Other Ambulatory Visit (HOSPITAL_BASED_OUTPATIENT_CLINIC_OR_DEPARTMENT_OTHER): Payer: Self-pay

## 2023-05-29 ENCOUNTER — Other Ambulatory Visit (HOSPITAL_BASED_OUTPATIENT_CLINIC_OR_DEPARTMENT_OTHER): Payer: Self-pay

## 2023-05-30 ENCOUNTER — Other Ambulatory Visit (HOSPITAL_BASED_OUTPATIENT_CLINIC_OR_DEPARTMENT_OTHER): Payer: Self-pay

## 2023-07-13 ENCOUNTER — Other Ambulatory Visit (HOSPITAL_BASED_OUTPATIENT_CLINIC_OR_DEPARTMENT_OTHER): Payer: Self-pay

## 2023-07-13 MED ORDER — SPIRONOLACTONE 100 MG PO TABS
100.0000 mg | ORAL_TABLET | Freq: Every day | ORAL | 1 refills | Status: AC
  Filled 2023-07-13: qty 30, 30d supply, fill #0

## 2023-07-13 MED ORDER — VALACYCLOVIR HCL 1 G PO TABS
1000.0000 mg | ORAL_TABLET | Freq: Every day | ORAL | 1 refills | Status: AC
  Filled 2023-07-13: qty 30, 30d supply, fill #0

## 2023-07-18 ENCOUNTER — Other Ambulatory Visit (HOSPITAL_BASED_OUTPATIENT_CLINIC_OR_DEPARTMENT_OTHER): Payer: Self-pay

## 2023-07-24 ENCOUNTER — Other Ambulatory Visit (HOSPITAL_BASED_OUTPATIENT_CLINIC_OR_DEPARTMENT_OTHER): Payer: Self-pay

## 2023-07-24 MED ORDER — TOPIRAMATE 25 MG PO TABS
25.0000 mg | ORAL_TABLET | Freq: Every day | ORAL | 1 refills | Status: AC
  Filled 2023-07-24: qty 30, 30d supply, fill #0

## 2023-07-28 ENCOUNTER — Other Ambulatory Visit (HOSPITAL_BASED_OUTPATIENT_CLINIC_OR_DEPARTMENT_OTHER): Payer: Self-pay

## 2023-08-07 ENCOUNTER — Other Ambulatory Visit (HOSPITAL_BASED_OUTPATIENT_CLINIC_OR_DEPARTMENT_OTHER): Payer: Self-pay

## 2024-05-09 ENCOUNTER — Other Ambulatory Visit (HOSPITAL_BASED_OUTPATIENT_CLINIC_OR_DEPARTMENT_OTHER): Payer: Self-pay

## 2024-05-09 MED ORDER — FLUZONE 0.5 ML IM SUSY
0.5000 mL | PREFILLED_SYRINGE | Freq: Once | INTRAMUSCULAR | 0 refills | Status: AC
  Filled 2024-05-09: qty 0.5, 1d supply, fill #0

## 2024-08-13 ENCOUNTER — Other Ambulatory Visit (HOSPITAL_BASED_OUTPATIENT_CLINIC_OR_DEPARTMENT_OTHER): Payer: Self-pay

## 2024-08-13 MED ORDER — VALACYCLOVIR HCL 1 G PO TABS
1000.0000 mg | ORAL_TABLET | Freq: Every day | ORAL | 1 refills | Status: AC
  Filled 2024-08-13 – 2024-08-20 (×2): qty 90, 90d supply, fill #0

## 2024-08-20 ENCOUNTER — Other Ambulatory Visit (HOSPITAL_BASED_OUTPATIENT_CLINIC_OR_DEPARTMENT_OTHER): Payer: Self-pay

## 2024-08-23 ENCOUNTER — Other Ambulatory Visit (HOSPITAL_BASED_OUTPATIENT_CLINIC_OR_DEPARTMENT_OTHER): Payer: Self-pay

## 2024-08-23 MED ORDER — DULOXETINE HCL 30 MG PO CPEP
60.0000 mg | ORAL_CAPSULE | Freq: Every day | ORAL | 1 refills | Status: AC
  Filled 2024-08-23: qty 180, 90d supply, fill #0

## 2024-08-23 MED ORDER — TRETINOIN 0.1 % EX CREA: 1.0000 | TOPICAL_CREAM | Freq: Every evening | CUTANEOUS | 5 refills | Status: AC

## 2024-08-23 MED ORDER — PROGESTERONE MICRONIZED 100 MG PO CAPS: 200.0000 mg | ORAL_CAPSULE | Freq: Every day | ORAL | 2 refills | Status: AC

## 2024-08-23 MED ORDER — VALACYCLOVIR HCL 1 G PO TABS: 1000.0000 mg | ORAL_TABLET | Freq: Every day | ORAL | 3 refills | Status: AC

## 2024-08-23 MED ORDER — ESTRADIOL 0.01 % VA CREA: TOPICAL_CREAM | VAGINAL | 3 refills | Status: AC
